# Patient Record
Sex: Female | Born: 1950 | Race: Black or African American | Hispanic: No | Smoking: Former smoker
Health system: Southern US, Community
[De-identification: ages and names within clinical notes are randomized; demographics above are authoritative.]

## PROBLEM LIST (undated history)

## (undated) DIAGNOSIS — F419 Anxiety disorder, unspecified: Secondary | ICD-10-CM

## (undated) DIAGNOSIS — K5790 Diverticulosis of intestine, part unspecified, without perforation or abscess without bleeding: Secondary | ICD-10-CM

## (undated) DIAGNOSIS — M1990 Unspecified osteoarthritis, unspecified site: Secondary | ICD-10-CM

## (undated) DIAGNOSIS — IMO0001 Reserved for inherently not codable concepts without codable children: Secondary | ICD-10-CM

## (undated) DIAGNOSIS — I10 Essential (primary) hypertension: Secondary | ICD-10-CM

## (undated) DIAGNOSIS — K219 Gastro-esophageal reflux disease without esophagitis: Secondary | ICD-10-CM

## (undated) DIAGNOSIS — G5600 Carpal tunnel syndrome, unspecified upper limb: Secondary | ICD-10-CM

## (undated) HISTORY — PX: CHOLECYSTECTOMY: SHX55

## (undated) HISTORY — PX: ABDOMINAL HYSTERECTOMY: SHX81

## (undated) HISTORY — DX: Unspecified osteoarthritis, unspecified site: M19.90

## (undated) HISTORY — DX: Carpal tunnel syndrome, unspecified upper limb: G56.00

## (undated) HISTORY — PX: TONSILLECTOMY: SUR1361

---

## 2002-07-16 ENCOUNTER — Ambulatory Visit (HOSPITAL_COMMUNITY): Admission: RE | Admit: 2002-07-16 | Discharge: 2002-07-16 | Payer: Self-pay | Admitting: Family Medicine

## 2002-07-29 ENCOUNTER — Ambulatory Visit (HOSPITAL_COMMUNITY): Admission: RE | Admit: 2002-07-29 | Discharge: 2002-07-29 | Payer: Self-pay | Admitting: Family Medicine

## 2002-09-11 ENCOUNTER — Ambulatory Visit (HOSPITAL_COMMUNITY): Admission: RE | Admit: 2002-09-11 | Discharge: 2002-09-11 | Payer: Self-pay | Admitting: General Surgery

## 2003-10-29 ENCOUNTER — Ambulatory Visit (HOSPITAL_COMMUNITY): Admission: RE | Admit: 2003-10-29 | Discharge: 2003-10-29 | Payer: Self-pay | Admitting: Orthopedic Surgery

## 2003-11-19 ENCOUNTER — Encounter: Admission: RE | Admit: 2003-11-19 | Discharge: 2003-11-19 | Payer: Self-pay | Admitting: Orthopedic Surgery

## 2003-12-03 ENCOUNTER — Encounter: Admission: RE | Admit: 2003-12-03 | Discharge: 2003-12-03 | Payer: Self-pay | Admitting: Orthopedic Surgery

## 2003-12-17 ENCOUNTER — Encounter: Admission: RE | Admit: 2003-12-17 | Discharge: 2003-12-17 | Payer: Self-pay | Admitting: Orthopedic Surgery

## 2004-02-13 ENCOUNTER — Emergency Department (HOSPITAL_COMMUNITY): Admission: EM | Admit: 2004-02-13 | Discharge: 2004-02-13 | Payer: Self-pay | Admitting: Emergency Medicine

## 2004-09-02 ENCOUNTER — Emergency Department (HOSPITAL_COMMUNITY): Admission: EM | Admit: 2004-09-02 | Discharge: 2004-09-02 | Payer: Self-pay | Admitting: Emergency Medicine

## 2004-10-11 ENCOUNTER — Emergency Department (HOSPITAL_COMMUNITY): Admission: EM | Admit: 2004-10-11 | Discharge: 2004-10-11 | Payer: Self-pay | Admitting: Emergency Medicine

## 2005-06-06 ENCOUNTER — Emergency Department (HOSPITAL_COMMUNITY): Admission: EM | Admit: 2005-06-06 | Discharge: 2005-06-06 | Payer: Self-pay | Admitting: Emergency Medicine

## 2005-06-08 ENCOUNTER — Emergency Department (HOSPITAL_COMMUNITY): Admission: EM | Admit: 2005-06-08 | Discharge: 2005-06-08 | Payer: Self-pay | Admitting: Emergency Medicine

## 2005-06-08 ENCOUNTER — Ambulatory Visit (HOSPITAL_COMMUNITY): Admission: RE | Admit: 2005-06-08 | Discharge: 2005-06-08 | Payer: Self-pay | Admitting: General Surgery

## 2005-06-10 ENCOUNTER — Emergency Department (HOSPITAL_COMMUNITY): Admission: EM | Admit: 2005-06-10 | Discharge: 2005-06-10 | Payer: Self-pay | Admitting: Emergency Medicine

## 2006-02-23 ENCOUNTER — Encounter (INDEPENDENT_AMBULATORY_CARE_PROVIDER_SITE_OTHER): Payer: Self-pay | Admitting: Family Medicine

## 2006-05-22 ENCOUNTER — Emergency Department (HOSPITAL_COMMUNITY): Admission: EM | Admit: 2006-05-22 | Discharge: 2006-05-22 | Payer: Self-pay | Admitting: Emergency Medicine

## 2006-06-02 ENCOUNTER — Emergency Department (HOSPITAL_COMMUNITY): Admission: EM | Admit: 2006-06-02 | Discharge: 2006-06-02 | Payer: Self-pay | Admitting: Emergency Medicine

## 2006-08-04 ENCOUNTER — Emergency Department (HOSPITAL_COMMUNITY): Admission: EM | Admit: 2006-08-04 | Discharge: 2006-08-04 | Payer: Self-pay | Admitting: Emergency Medicine

## 2006-08-07 ENCOUNTER — Emergency Department (HOSPITAL_COMMUNITY): Admission: EM | Admit: 2006-08-07 | Discharge: 2006-08-07 | Payer: Self-pay | Admitting: Emergency Medicine

## 2006-11-09 ENCOUNTER — Ambulatory Visit (HOSPITAL_COMMUNITY): Admission: RE | Admit: 2006-11-09 | Discharge: 2006-11-09 | Payer: Self-pay | Admitting: Family Medicine

## 2007-02-15 ENCOUNTER — Ambulatory Visit (HOSPITAL_COMMUNITY): Admission: RE | Admit: 2007-02-15 | Discharge: 2007-02-15 | Payer: Self-pay | Admitting: Family Medicine

## 2007-06-14 ENCOUNTER — Ambulatory Visit (HOSPITAL_COMMUNITY): Admission: RE | Admit: 2007-06-14 | Discharge: 2007-06-14 | Payer: Self-pay | Admitting: Family Medicine

## 2007-12-20 ENCOUNTER — Ambulatory Visit: Payer: Self-pay | Admitting: Family Medicine

## 2007-12-20 DIAGNOSIS — M5136 Other intervertebral disc degeneration, lumbar region: Secondary | ICD-10-CM

## 2007-12-20 DIAGNOSIS — E785 Hyperlipidemia, unspecified: Secondary | ICD-10-CM

## 2007-12-20 DIAGNOSIS — J42 Unspecified chronic bronchitis: Secondary | ICD-10-CM

## 2007-12-20 DIAGNOSIS — I10 Essential (primary) hypertension: Secondary | ICD-10-CM

## 2007-12-20 DIAGNOSIS — K589 Irritable bowel syndrome without diarrhea: Secondary | ICD-10-CM

## 2007-12-20 DIAGNOSIS — G5600 Carpal tunnel syndrome, unspecified upper limb: Secondary | ICD-10-CM | POA: Insufficient documentation

## 2007-12-20 DIAGNOSIS — K219 Gastro-esophageal reflux disease without esophagitis: Secondary | ICD-10-CM | POA: Insufficient documentation

## 2007-12-20 LAB — CONVERTED CEMR LAB
Bilirubin Urine: NEGATIVE
Cholesterol, target level: 200 mg/dL
Glucose, Urine, Semiquant: NEGATIVE
HDL goal, serum: 40 mg/dL
Ketones, urine, test strip: NEGATIVE
LDL Goal: 130 mg/dL
Nitrite: NEGATIVE
Specific Gravity, Urine: 1.02
Urobilinogen, UA: 1
WBC Urine, dipstick: NEGATIVE
pH: 6

## 2007-12-28 ENCOUNTER — Encounter (INDEPENDENT_AMBULATORY_CARE_PROVIDER_SITE_OTHER): Payer: Self-pay | Admitting: Family Medicine

## 2008-01-02 ENCOUNTER — Encounter (INDEPENDENT_AMBULATORY_CARE_PROVIDER_SITE_OTHER): Payer: Self-pay | Admitting: Family Medicine

## 2008-01-03 ENCOUNTER — Ambulatory Visit: Payer: Self-pay | Admitting: Family Medicine

## 2008-01-03 LAB — CONVERTED CEMR LAB
ALT: 16 units/L (ref 0–35)
AST: 15 units/L (ref 0–37)
Albumin: 4.5 g/dL (ref 3.5–5.2)
Alkaline Phosphatase: 77 units/L (ref 39–117)
BUN: 13 mg/dL (ref 6–23)
Basophils Absolute: 0 10*3/uL (ref 0.0–0.1)
Basophils Relative: 1 % (ref 0–1)
CO2: 21 meq/L (ref 19–32)
Calcium: 9.3 mg/dL (ref 8.4–10.5)
Chloride: 105 meq/L (ref 96–112)
Cholesterol: 185 mg/dL (ref 0–200)
Creatinine, Ser: 0.91 mg/dL (ref 0.40–1.20)
Eosinophils Absolute: 0.1 10*3/uL (ref 0.0–0.7)
Eosinophils Relative: 2 % (ref 0–5)
Glucose, Bld: 90 mg/dL (ref 70–99)
HCT: 40.3 % (ref 36.0–46.0)
HDL: 94 mg/dL (ref >39)
Hemoglobin: 12.9 g/dL (ref 12.0–15.0)
LDL Cholesterol: 72 mg/dL (ref 0–99)
Lymphocytes Relative: 41 % (ref 12–46)
Lymphs Abs: 2.1 10*3/uL (ref 0.7–4.0)
MCHC: 32 g/dL (ref 30.0–36.0)
MCV: 98.1 fL (ref 78.0–100.0)
Monocytes Absolute: 0.4 10*3/uL (ref 0.1–1.0)
Monocytes Relative: 8 % (ref 3–12)
Neutro Abs: 2.4 10*3/uL (ref 1.7–7.7)
Neutrophils Relative %: 48 % (ref 43–77)
Platelets: 365 10*3/uL (ref 150–400)
Potassium: 4.6 meq/L (ref 3.5–5.3)
RBC: 4.11 M/uL (ref 3.87–5.11)
RDW: 13.8 % (ref 11.5–15.5)
Sodium: 140 meq/L (ref 135–145)
TSH: 1.042 microintl units/mL (ref 0.350–4.50)
Total Bilirubin: 0.8 mg/dL (ref 0.3–1.2)
Total CHOL/HDL Ratio: 2
Total Protein: 7.3 g/dL (ref 6.0–8.3)
Triglycerides: 95 mg/dL (ref <150)
VLDL: 19 mg/dL (ref 0–40)
WBC: 5 10*3/uL (ref 4.0–10.5)

## 2008-01-15 ENCOUNTER — Ambulatory Visit (HOSPITAL_COMMUNITY): Admission: RE | Admit: 2008-01-15 | Discharge: 2008-01-15 | Payer: Self-pay | Admitting: Family Medicine

## 2008-01-29 ENCOUNTER — Encounter (HOSPITAL_COMMUNITY): Admission: RE | Admit: 2008-01-29 | Discharge: 2008-02-08 | Payer: Self-pay | Admitting: Family Medicine

## 2008-01-31 ENCOUNTER — Ambulatory Visit: Payer: Self-pay | Admitting: Family Medicine

## 2008-01-31 DIAGNOSIS — F17200 Nicotine dependence, unspecified, uncomplicated: Secondary | ICD-10-CM

## 2008-01-31 DIAGNOSIS — G4700 Insomnia, unspecified: Secondary | ICD-10-CM | POA: Insufficient documentation

## 2008-02-13 ENCOUNTER — Encounter (INDEPENDENT_AMBULATORY_CARE_PROVIDER_SITE_OTHER): Payer: Self-pay | Admitting: Family Medicine

## 2008-02-28 ENCOUNTER — Ambulatory Visit: Payer: Self-pay | Admitting: Family Medicine

## 2008-03-05 ENCOUNTER — Ambulatory Visit (HOSPITAL_COMMUNITY): Admission: RE | Admit: 2008-03-05 | Discharge: 2008-03-05 | Payer: Self-pay | Admitting: Family Medicine

## 2008-03-18 ENCOUNTER — Encounter (INDEPENDENT_AMBULATORY_CARE_PROVIDER_SITE_OTHER): Payer: Self-pay | Admitting: Family Medicine

## 2008-03-26 ENCOUNTER — Ambulatory Visit (HOSPITAL_COMMUNITY): Admission: RE | Admit: 2008-03-26 | Discharge: 2008-03-26 | Payer: Self-pay | Admitting: Internal Medicine

## 2008-03-26 ENCOUNTER — Ambulatory Visit: Payer: Self-pay | Admitting: Internal Medicine

## 2008-04-15 ENCOUNTER — Telehealth (INDEPENDENT_AMBULATORY_CARE_PROVIDER_SITE_OTHER): Payer: Self-pay | Admitting: *Deleted

## 2008-04-18 ENCOUNTER — Ambulatory Visit: Payer: Self-pay | Admitting: Family Medicine

## 2008-04-18 ENCOUNTER — Ambulatory Visit (HOSPITAL_COMMUNITY): Admission: RE | Admit: 2008-04-18 | Discharge: 2008-04-18 | Payer: Self-pay | Admitting: Family Medicine

## 2008-04-23 ENCOUNTER — Encounter: Payer: Self-pay | Admitting: Gastroenterology

## 2008-05-01 ENCOUNTER — Ambulatory Visit: Payer: Self-pay | Admitting: Family Medicine

## 2008-05-29 ENCOUNTER — Ambulatory Visit: Payer: Self-pay | Admitting: Family Medicine

## 2008-05-29 ENCOUNTER — Ambulatory Visit (HOSPITAL_COMMUNITY): Admission: RE | Admit: 2008-05-29 | Discharge: 2008-05-29 | Payer: Self-pay | Admitting: Family Medicine

## 2008-05-29 LAB — CONVERTED CEMR LAB

## 2008-05-30 ENCOUNTER — Encounter (INDEPENDENT_AMBULATORY_CARE_PROVIDER_SITE_OTHER): Payer: Self-pay | Admitting: Family Medicine

## 2008-05-30 LAB — CONVERTED CEMR LAB
ALT: 17 units/L (ref 0–35)
AST: 19 units/L (ref 0–37)
Albumin: 4.1 g/dL (ref 3.5–5.2)
Alkaline Phosphatase: 84 units/L (ref 39–117)
Amylase: 35 units/L (ref 0–105)
BUN: 9 mg/dL (ref 6–23)
Basophils Absolute: 0 10*3/uL (ref 0.0–0.1)
Basophils Relative: 1 % (ref 0–1)
CO2: 21 meq/L (ref 19–32)
Calcium: 9 mg/dL (ref 8.4–10.5)
Chloride: 108 meq/L (ref 96–112)
Creatinine, Ser: 0.8 mg/dL (ref 0.40–1.20)
Eosinophils Absolute: 0.1 10*3/uL (ref 0.0–0.7)
Eosinophils Relative: 3 % (ref 0–5)
Glucose, Bld: 91 mg/dL (ref 70–99)
HCT: 38 % (ref 36.0–46.0)
Hemoglobin: 12.9 g/dL (ref 12.0–15.0)
Lipase: 18 units/L (ref 0–75)
Lymphocytes Relative: 36 % (ref 12–46)
Lymphs Abs: 1.9 10*3/uL (ref 0.7–4.0)
MCHC: 33.9 g/dL (ref 30.0–36.0)
MCV: 95 fL (ref 78.0–100.0)
Monocytes Absolute: 0.5 10*3/uL (ref 0.1–1.0)
Monocytes Relative: 9 % (ref 3–12)
Neutro Abs: 2.7 10*3/uL (ref 1.7–7.7)
Neutrophils Relative %: 52 % (ref 43–77)
Platelets: 373 10*3/uL (ref 150–400)
Potassium: 4.1 meq/L (ref 3.5–5.3)
RBC: 4 M/uL (ref 3.87–5.11)
RDW: 13.5 % (ref 11.5–15.5)
Sodium: 141 meq/L (ref 135–145)
Total Bilirubin: 0.4 mg/dL (ref 0.3–1.2)
Total Protein: 6.8 g/dL (ref 6.0–8.3)
WBC: 5.2 10*3/uL (ref 4.0–10.5)

## 2008-06-26 ENCOUNTER — Ambulatory Visit: Payer: Self-pay | Admitting: Family Medicine

## 2008-07-17 ENCOUNTER — Ambulatory Visit: Payer: Self-pay | Admitting: Family Medicine

## 2008-07-17 ENCOUNTER — Ambulatory Visit (HOSPITAL_COMMUNITY): Admission: RE | Admit: 2008-07-17 | Discharge: 2008-07-17 | Payer: Self-pay | Admitting: Family Medicine

## 2008-07-17 ENCOUNTER — Telehealth (INDEPENDENT_AMBULATORY_CARE_PROVIDER_SITE_OTHER): Payer: Self-pay | Admitting: *Deleted

## 2008-07-17 LAB — CONVERTED CEMR LAB
Bilirubin Urine: NEGATIVE
Blood in Urine, dipstick: NEGATIVE
Glucose, Urine, Semiquant: NEGATIVE
Ketones, urine, test strip: NEGATIVE
Nitrite: NEGATIVE
Protein, U semiquant: NEGATIVE
Specific Gravity, Urine: 1.015
Urobilinogen, UA: 0.2
WBC Urine, dipstick: NEGATIVE
pH: 5.5

## 2008-07-23 ENCOUNTER — Encounter (INDEPENDENT_AMBULATORY_CARE_PROVIDER_SITE_OTHER): Payer: Self-pay | Admitting: Family Medicine

## 2008-08-07 ENCOUNTER — Ambulatory Visit: Payer: Self-pay | Admitting: Family Medicine

## 2008-09-04 ENCOUNTER — Ambulatory Visit: Payer: Self-pay | Admitting: Family Medicine

## 2009-02-04 ENCOUNTER — Emergency Department (HOSPITAL_COMMUNITY): Admission: EM | Admit: 2009-02-04 | Discharge: 2009-02-05 | Payer: Self-pay | Admitting: Emergency Medicine

## 2009-04-13 ENCOUNTER — Ambulatory Visit (HOSPITAL_COMMUNITY): Admission: RE | Admit: 2009-04-13 | Discharge: 2009-04-13 | Payer: Self-pay | Admitting: Family Medicine

## 2009-04-27 ENCOUNTER — Encounter (INDEPENDENT_AMBULATORY_CARE_PROVIDER_SITE_OTHER): Payer: Self-pay | Admitting: *Deleted

## 2010-02-07 ENCOUNTER — Encounter: Payer: Self-pay | Admitting: Family Medicine

## 2010-02-16 NOTE — Letter (Signed)
Summary: MEDICAL RELEASE  MEDICAL RELEASE   Imported By: Lind Guest 04/27/2009 13:23:07  _____________________________________________________________________  External Attachment:    Type:   Image     Comment:   External Document

## 2010-05-06 ENCOUNTER — Emergency Department (HOSPITAL_COMMUNITY): Payer: PRIVATE HEALTH INSURANCE

## 2010-05-06 ENCOUNTER — Emergency Department (HOSPITAL_COMMUNITY)
Admission: EM | Admit: 2010-05-06 | Discharge: 2010-05-07 | Disposition: A | Discharge status: Home / Self Care | Payer: PRIVATE HEALTH INSURANCE | Attending: Emergency Medicine | Admitting: Emergency Medicine

## 2010-05-06 DIAGNOSIS — R079 Chest pain, unspecified: Secondary | ICD-10-CM | POA: Insufficient documentation

## 2010-05-06 DIAGNOSIS — Z79899 Other long term (current) drug therapy: Secondary | ICD-10-CM | POA: Insufficient documentation

## 2010-05-06 DIAGNOSIS — Z7901 Long term (current) use of anticoagulants: Secondary | ICD-10-CM | POA: Insufficient documentation

## 2010-05-06 DIAGNOSIS — I4949 Other premature depolarization: Secondary | ICD-10-CM | POA: Insufficient documentation

## 2010-05-06 LAB — CBC
MCHC: 33.8 g/dL (ref 30.0–36.0)
MCV: 99 fL (ref 78.0–100.0)
Platelets: 336 10*3/uL (ref 150–400)
RDW: 13.8 % (ref 11.5–15.5)
WBC: 7 10*3/uL (ref 4.0–10.5)

## 2010-05-06 LAB — DIFFERENTIAL
Basophils Absolute: 0 10*3/uL (ref 0.0–0.1)
Eosinophils Absolute: 0.2 10*3/uL (ref 0.0–0.7)
Eosinophils Relative: 2 % (ref 0–5)
Monocytes Absolute: 0.4 10*3/uL (ref 0.1–1.0)

## 2010-05-06 LAB — POCT CARDIAC MARKERS
CKMB, poc: <1 ng/mL — ABNORMAL LOW (ref 1.0–8.0)
Myoglobin, poc: 32 ng/mL (ref 12–200)
Troponin i, poc: <0.05 ng/mL (ref 0.00–0.09)

## 2010-05-07 LAB — BASIC METABOLIC PANEL
Calcium: 9 mg/dL (ref 8.4–10.5)
Creatinine, Ser: 0.93 mg/dL (ref 0.4–1.2)
GFR calc Af Amer: >60 mL/min (ref >60)
GFR calc non Af Amer: >60 mL/min (ref >60)
Glucose, Bld: 108 mg/dL — ABNORMAL HIGH (ref 70–99)
Sodium: 136 mEq/L (ref 135–145)

## 2010-06-01 NOTE — Op Note (Signed)
NAME:  Tammy Austin, Tammy Austin                ACCOUNT NO.:  [id number]   MEDICAL RECORD NO.:  [id number]          PATIENT TYPE:  AMB   LOCATION:  DAY                           FACILITY:  [facility]   PHYSICIAN:  R. Roetta Sessions, M.D. DATE OF BIRTH:  1950/10/31   DATE OF PROCEDURE:  03/26/2008  DATE OF DISCHARGE:                               OPERATIVE REPORT   PROCEDURE:  Screening colonoscopy.   INDICATIONS FOR PROCEDURE:  A 61 year old lady, referred by Dr. Erby Pian  for colorectal cancer screening.  Last colonoscopy several years ago by  Dr. Katrinka Blazing.  She reportedly has a history of diverticulosis.  No family  history of colon cancer in any first-degree relatives.  She currently  has no lower GI tract symptoms.  Risks, benefits, alternatives and  limitations have been discussed, questions answered.  She is agreeable.  Please see the documentation in the medical record.   PROCEDURE NOTE:  O2 saturation, blood pressure, pulse, respirations were  monitored throughout the entire procedure.   CONSCIOUS SEDATION:  Versed 4 mg IV, Demerol 100 mg IV in divided doses.   INSTRUMENT:  Pentax video chip system.   FINDINGS:  Digital rectal exam revealed no abnormalities.  The prep was  marginal to poor, but do-able.  Colon:  Colonic mucosa was surveyed from  rectosigmoid junction through the left transverse, right colon to the  appendiceal orifice, ileocecal valve, cecum.  These structures well-seen  and photographed for the record.  From this level, the scope was slowly  withdrawn.  All previously-mentioned mucosal surfaces were again seen.  Copious lavage and suctioning had to take place on the way in to get  adequate visualization.  There was quite a bit of granular, viscous  stool throughout the colon, which made exam more challenging.  Cecum,  ileocecal valve, appendiceal orifice were well-identified.   From this level, the scope was slowly and cautiously withdrawn.  All  previously-mentioned mucosal  surfaces were again seen.  The patient was  noted to have pancolonic diverticula.  Remainder of colonic mucosa  appeared normal.  The scope was put within the rectum, where thorough  examination of the rectal mucosa, including retroflexion in anal verge,  demonstrated no abnormalities.  The patient tolerated the procedure  well, was reacted in endoscopy.   IMPRESSION:  1. Normal rectum.  2. Pancolonic diverticula.  Remainder of colonic mucosa appeared      normal.  3. Cecal withdrawal time over 6 minutes.   RECOMMENDATIONS:  Diverticulosis literature provided to Ms. Brockbank.  Recommend repeat screening colonoscopy 10 years.      Jonathon Bellows, M.D.  Electronically Signed     RMR/MEDQ  D:  03/26/2008  T:  03/26/2008  Job:  [id number]   cc:   Franchot Heidelberg, M.D.

## 2010-06-04 NOTE — H&P (Signed)
   NAME:  Tammy Austin, Tammy Austin                          ACCOUNT NO.:  [id number]   MEDICAL RECORD NO.:  [id number]                   PATIENT TYPE:  AMB   LOCATION:  DAY                                  FACILITY:  [facility]   PHYSICIAN:  Jerolyn Shin C. Katrinka Blazing, M.D.                DATE OF BIRTH:  Jan 02, 1951   DATE OF ADMISSION:  DATE OF DISCHARGE:                                HISTORY & PHYSICAL   HISTORY OF PRESENT ILLNESS:  [age]-year-old patient with a history of  osteoarthritis, chronic abdominal pain and bilateral knee pain.  The patient  has had episodes of increasing gaseous distention, pain in her left lower  quadrant, chronic constipation requiring laxatives.  She has been followed  by the health department and is referred for upper and lower endoscopies.   PAST HISTORY:  1. She has chronic depression.  2. Osteoarthritis.  3. Peptic ulcer disease.  4. Hepatitis B.   MEDICATIONS:  Her only medication is Tylenol.  She was recently started on:  1. Prilosec 40 mg b.i.d.  2. Ultram 50 mg q.i.d.   PAST SURGICAL HISTORY:  Surgery includes:  1. Abdominal hysterectomy.  2. Cholecystectomy.   FAMILY HISTORY:  Family history is positive for hypertension,  atherosclerotic heart disease, diabetes mellitus, DVT, depression and  prostate cancer.   SOCIAL HISTORY:  She is divorced.  She drinks one quart of beer per day.  She smokes a half a pack of cigarettes per day.  She is unemployed.  She has  one child.   REVIEW OF SYSTEMS:  Review of systems is positive for headache, heartburn,  flatulence, pain and bloating, diffuse joint pain and depression.   PHYSICAL EXAMINATION:  VITAL SIGNS:  Blood pressure 120/84, pulse 82,  respirations 16, weight 149 pounds.  HEENT:  Unremarkable.  NECK:  Neck is supple without JVD or bruit.  CHEST:  Chest clear to auscultation without rales or rub.  HEART:  Regular rate and rhythm without murmur, gallop or rub.  ABDOMEN:  Abdomen soft and nontender.  No  masses.  RECTAL:  Normal.  Stool guaiac negative.  EXTREMITIES:  Positive crepitus of both knees, otherwise, normal.  NEUROLOGIC:  No focal motor, sensory or cerebellar deficit.   IMPRESSION:  1. Recurrent abdominal pain with change in bowel habits.  2. Peptic ulcer disease by history with possible exacerbation.  3. Osteoarthritis.  4. Hepatitis B.   PLAN:  The patient will have upper and lower endoscopies.                                               Dirk Dress. Katrinka Blazing, M.D.    LCS/MEDQ  D:  09/10/2002  T:  09/11/2002  Job:  [id number]

## 2010-11-01 LAB — CULTURE, ROUTINE-ABSCESS

## 2011-06-02 ENCOUNTER — Ambulatory Visit: Payer: Medicaid Other | Admitting: Family Medicine

## 2011-06-21 ENCOUNTER — Ambulatory Visit: Payer: Medicaid Other | Admitting: Family Medicine

## 2011-06-28 ENCOUNTER — Emergency Department (HOSPITAL_COMMUNITY)
Admission: EM | Admit: 2011-06-28 | Discharge: 2011-06-28 | Disposition: A | Discharge status: Home / Self Care | Payer: PRIVATE HEALTH INSURANCE | Attending: Emergency Medicine | Admitting: Emergency Medicine

## 2011-06-28 ENCOUNTER — Encounter (HOSPITAL_COMMUNITY): Payer: Self-pay | Admitting: *Deleted

## 2011-06-28 DIAGNOSIS — F17200 Nicotine dependence, unspecified, uncomplicated: Secondary | ICD-10-CM | POA: Insufficient documentation

## 2011-06-28 DIAGNOSIS — T3995XA Adverse effect of unspecified nonopioid analgesic, antipyretic and antirheumatic, initial encounter: Secondary | ICD-10-CM | POA: Insufficient documentation

## 2011-06-28 DIAGNOSIS — K219 Gastro-esophageal reflux disease without esophagitis: Secondary | ICD-10-CM | POA: Insufficient documentation

## 2011-06-28 DIAGNOSIS — K2960 Other gastritis without bleeding: Secondary | ICD-10-CM | POA: Insufficient documentation

## 2011-06-28 DIAGNOSIS — I10 Essential (primary) hypertension: Secondary | ICD-10-CM | POA: Insufficient documentation

## 2011-06-28 HISTORY — DX: Reserved for inherently not codable concepts without codable children: IMO0001

## 2011-06-28 HISTORY — DX: Diverticulosis of intestine, part unspecified, without perforation or abscess without bleeding: K57.90

## 2011-06-28 HISTORY — DX: Essential (primary) hypertension: I10

## 2011-06-28 HISTORY — DX: Gastro-esophageal reflux disease without esophagitis: K21.9

## 2011-06-28 MED ORDER — PANTOPRAZOLE SODIUM 40 MG PO TBEC
40.0000 mg | DELAYED_RELEASE_TABLET | Freq: Once | ORAL | Status: AC
  Administered 2011-06-28: 40 mg via ORAL
  Filled 2011-06-28: qty 1

## 2011-06-28 MED ORDER — TRAMADOL HCL 50 MG PO TABS: 50.0000 mg | ORAL_TABLET | Freq: Four times a day (QID) | ORAL | Status: AC | PRN

## 2011-06-28 MED ORDER — CALCIUM CARBONATE ANTACID 600 MG PO CHEW: 600.0000 mg | CHEWABLE_TABLET | Freq: Three times a day (TID) | ORAL | Status: DC | PRN

## 2011-06-28 MED ORDER — OMEPRAZOLE 40 MG PO CPDR: 40.0000 mg | DELAYED_RELEASE_CAPSULE | Freq: Every day | ORAL | Status: DC

## 2011-06-28 MED ORDER — GI COCKTAIL ~~LOC~~
30.0000 mL | Freq: Once | ORAL | Status: AC
  Administered 2011-06-28: 30 mL via ORAL
  Filled 2011-06-28: qty 30

## 2011-06-28 NOTE — Discharge Instructions (Signed)
Stop taking Naproxen and all other non-steroidal anti-inflammatory medicines. Follow up with your primary MD for referral to gastroenterologist if symptoms persist.   Gastritis Gastritis is an inflammation (the body's way of reacting to injury and/or infection) of the stomach. It is often caused by viral or bacterial (germ) infections. It can also be caused by chemicals (including alcohol) and medications. This illness may be associated with generalized malaise (feeling tired, not well), cramps, and fever. The illness may last 2 to 7 days. If symptoms of gastritis continue, gastroscopy (looking into the stomach with a telescope-like instrument), biopsy (taking tissue samples), and/or blood tests may be necessary to determine the cause. Antibiotics will not affect the illness unless there is a bacterial infection present. One common bacterial cause of gastritis is an organism known as H. Pylori. This can be treated with antibiotics. Other forms of gastritis are caused by too much acid in the stomach. They can be treated with medications such as H2 blockers and antacids. Home treatment is usually all that is needed. Young children will quickly become dehydrated (loss of body fluids) if vomiting and diarrhea are both present. Medications may be given to control nausea. Medications are usually not given for diarrhea unless especially bothersome. Some medications slow the removal of the virus from the gastrointestinal tract. This slows down the healing process. HOME CARE INSTRUCTIONS Home care instructions for nausea and vomiting:  For adults: drink small amounts of fluids often. Drink at least 2 quarts a day. Take sips frequently. Do not drink large amounts of fluid at one time. This may worsen the nausea.   Only take over-the-counter or prescription medicines for pain, discomfort, or fever as directed by your caregiver.   Drink clear liquids only. Those are anything you can see through such as water, broth,  or soft drinks.   Once you are keeping clear liquids down, you may start full liquids, soups, juices, and ice cream or sherbet. Slowly add bland (plain, not spicy) foods to your diet.  Home care instructions for diarrhea:  Diarrhea can be caused by bacterial infections or a virus. Your condition should improve with time, rest, fluids, and/or anti-diarrheal medication.   Until your diarrhea is under control, you should drink clear liquids often in small amounts. Clear liquids include: water, broth, jell-o water and weak tea.  Avoid:  Milk.   Fruits.   Tobacco.   Alcohol.   Extremely hot or cold fluids.   Too much intake of anything at one time.  When your diarrhea stops you may add the following foods, which help the stool to become more formed:  Rice.   Bananas.   Apples without skin.   Dry toast.  Once these foods are tolerated you may add low-fat yogurt and low-fat cottage cheese. They will help to restore the normal bacterial balance in your bowel. Wash your hands well to avoid spreading bacteria (germ) or virus. SEEK IMMEDIATE MEDICAL CARE IF:   You are unable to keep fluids down.   Vomiting or diarrhea become persistent (constant).   Abdominal pain develops, increases, or localizes. (Right sided pain can be appendicitis. Left sided pain in adults can be diverticulitis.)   You develop a fever (an oral temperature above 102 F (38.9 C)).   Diarrhea becomes excessive or contains blood or mucus.   You have excessive weakness, dizziness, fainting or extreme thirst.   You are not improving or you are getting worse.   You have any other questions or concerns.  Document Released: 12/28/2000 Document Revised: 12/23/2010 Document Reviewed: 01/03/2005 [facility] Patient Information 2012 [place], [place].

## 2011-06-28 NOTE — ED Provider Notes (Signed)
History  This chart was scribed for Tammy Racer, MD by Tammy Austin. This patient was seen in room [facility] and the patient's care was started at 3:15PM.  CSN: [id number]  Arrival date & time 06/28/11  1504   First MD Initiated Contact with Patient 06/28/11 1515      Chief Complaint  Patient presents with  . Abdominal Pain    Patient is a 61 y.o. female presenting with abdominal pain. The history is provided by the patient. No language interpreter was used.  Abdominal Pain The primary symptoms of the illness include abdominal pain and nausea. The primary symptoms of the illness do not include fever, vomiting or diarrhea. The current episode started 13 to 24 hours ago. The onset of the illness was gradual. The problem has been gradually worsening.  The abdominal pain is located in the epigastric region. The abdominal pain does not radiate.  Symptoms associated with the illness do not include hematuria, frequency or back pain. Significant associated medical issues include GERD and diverticulitis.    Tammy Austin is a 61 y.o. female who presents to the Emergency Department complaining of gradual onset, gradually worsening, constant epigastric tenderness with associated nausea that started last night. She has a h/o GERD but states that this pain is different from prior episodes of GERD.  She reports taking Pepto bismol and milk with no improvement in her symptoms. She is currently taking 500 mg of naproxen twice a day for joint pain. She states that she ran out of omeprazole 2 days ago. She denies diarrhea and emesis as associated symptoms. She also has a h/o HTN and diverticulitis.  She is a current everyday smoker and occasional alcohol use.    Past Medical History  Diagnosis Date  . Hypertension   . Reflux   . Diverticular disease     Past Surgical History  Procedure Date  . Abdominal hysterectomy   . Tonsillectomy   . Cholecystectomy     History reviewed. No  pertinent family history.  History  Substance Use Topics  . Smoking status: Current Everyday Smoker  . Smokeless tobacco: Not on file  . Alcohol Use: Yes    Review of Systems  Constitutional: Negative for fever.  HENT: Negative for congestion, sinus pressure and ear discharge.   Eyes: Negative for discharge.  Respiratory: Negative for cough.   Cardiovascular: Negative for chest pain.  Gastrointestinal: Positive for nausea and abdominal pain. Negative for vomiting and diarrhea.  Genitourinary: Negative for frequency and hematuria.  Musculoskeletal: Negative for back pain.  Skin: Negative for rash.  Neurological: Negative for seizures and headaches.  Hematological: Negative.   Psychiatric/Behavioral: Negative for hallucinations.    Allergies  Ibuprofen  Home Medications   Current Outpatient Rx  Name Route Sig Dispense Refill  . AMLODIPINE BESYLATE 5 MG PO TABS Oral Take 5 mg by mouth daily.    Marland Kitchen BISACODYL LAXATIVE PO Oral Take 15-30 mLs by mouth as needed. For relief    . NAPROXEN 500 MG PO TABS Oral Take 500 mg by mouth 2 (two) times daily.    Marland Kitchen OMEPRAZOLE 20 MG PO CPDR Oral Take 20 mg by mouth daily.    Marland Kitchen CALCIUM CARBONATE ANTACID 600 MG PO CHEW Oral Chew 1 tablet (600 mg total) by mouth 3 (three) times daily as needed for heartburn. 90 tablet 0  . OMEPRAZOLE 40 MG PO CPDR Oral Take 1 capsule (40 mg total) by mouth daily. 30 capsule 0  . TRAMADOL HCL 50  MG PO TABS Oral Take 1 tablet (50 mg total) by mouth every 6 (six) hours as needed for pain. 20 tablet 0    Triage Vitals: BP 140/95  Pulse 89  Temp(Src) 98.1 F (36.7 C) (Oral)  Resp 18  Ht 5\' 5"  (1.651 m)  Wt 172 lb (78.019 kg)  BMI 28.62 kg/m2  SpO2 96%  Physical Exam  Nursing note and vitals reviewed. Constitutional: She is oriented to person, place, and time. She appears well-developed and well-nourished.  HENT:  Head: Normocephalic and atraumatic.  Eyes: Conjunctivae and EOM are normal. No scleral icterus.    Neck: Neck supple. No thyromegaly present.  Cardiovascular: Normal rate and regular rhythm.  Exam reveals no gallop and no friction rub.   No murmur heard. Pulmonary/Chest: No stridor. She has no wheezes. She has no rales. She exhibits no tenderness.  Abdominal: Soft. She exhibits no distension. There is tenderness (mild epigastric pain). There is no rebound.  Musculoskeletal: Normal range of motion. She exhibits no edema.  Lymphadenopathy:    She has no cervical adenopathy.  Neurological: She is oriented to person, place, and time. Coordination normal.  Skin: No rash noted. No erythema.  Psychiatric: She has a normal mood and affect. Her behavior is normal.    ED Course  Procedures (including critical care time)  DIAGNOSTIC STUDIES: Oxygen Saturation is 96% on room air, adequate by my interpretation.    COORDINATION OF CARE: 3:21PM-Discussed treatment plan with pt and pt agreed to plan.  Labs Reviewed - No data to display No results found.   1. NSAID induced gastritis       MDM  I personally performed the services described in this documentation, which was scribed in my presence. The recorded information has been reviewed and considered.  Advised to stop taking Naproxen and f/u with PMD for GI referral if symptoms persist. Return to ED if symptoms worsen    Tammy Racer, MD 06/28/11 [phone]

## 2011-06-28 NOTE — ED Notes (Signed)
Patient with no complaints at this time. Respirations even and unlabored. Skin warm/dry. Discharge instructions reviewed with patient at this time. Patient given opportunity to voice concerns/ask questions. Patient discharged at this time and left Emergency Department with steady gait.   

## 2011-06-28 NOTE — ED Notes (Signed)
Upper abd pain since yesterday, nausea, no diarrhea.  Usually feels better when drinks goats milk.  Out of stomach meds.

## 2011-07-28 ENCOUNTER — Ambulatory Visit (INDEPENDENT_AMBULATORY_CARE_PROVIDER_SITE_OTHER): Payer: Medicaid Other | Admitting: Family Medicine

## 2011-07-28 ENCOUNTER — Encounter: Payer: Self-pay | Admitting: Family Medicine

## 2011-07-28 VITALS — BP 120/90 | HR 82 | Resp 16 | Ht 65.0 in | Wt 174.4 lb

## 2011-07-28 DIAGNOSIS — I10 Essential (primary) hypertension: Secondary | ICD-10-CM

## 2011-07-28 DIAGNOSIS — E785 Hyperlipidemia, unspecified: Secondary | ICD-10-CM

## 2011-07-28 DIAGNOSIS — F17200 Nicotine dependence, unspecified, uncomplicated: Secondary | ICD-10-CM

## 2011-07-28 DIAGNOSIS — M545 Low back pain, unspecified: Secondary | ICD-10-CM

## 2011-07-28 DIAGNOSIS — K589 Irritable bowel syndrome without diarrhea: Secondary | ICD-10-CM

## 2011-07-28 DIAGNOSIS — K219 Gastro-esophageal reflux disease without esophagitis: Secondary | ICD-10-CM

## 2011-07-28 DIAGNOSIS — M129 Arthropathy, unspecified: Secondary | ICD-10-CM

## 2011-07-28 MED ORDER — OMEPRAZOLE 20 MG PO CPDR: 20.0000 mg | DELAYED_RELEASE_CAPSULE | Freq: Every day | ORAL | Status: DC

## 2011-07-28 MED ORDER — AMLODIPINE BESYLATE 5 MG PO TABS: 5.0000 mg | ORAL_TABLET | Freq: Every day | ORAL | Status: DC

## 2011-07-28 NOTE — Progress Notes (Signed)
  Subjective:    Patient ID: Tammy Austin, female    DOB: [birth year], 61 y.o.   MRN: [id number]  HPI Pt presents to establish medical care, previous PCP [facility]. History and medications reviewed She was recently seen at [facility] ED for arthritic pain, had x-rays done, has history of OA, currenlty on ultram, was on naprosyn but this caused upset stomach. History of GERD on  PPI- pt has colonoscopy per report HTN- has been on medication for 3-4 years on norvsac currently Disability has been on disability for many years secondary to OA, knee pain and back pain, she has had epidural injectionsi n the past.     Review of Systems  GEN- denies fatigue, fever, weight loss,weakness, recent illness HEENT- denies eye drainage, change in vision, nasal discharge, CVS- denies chest pain, palpitations RESP- denies SOB, cough, wheeze ABD- denies N/V, change in stools, abd pain GU- denies dysuria, hematuria, dribbling, incontinence MSK- + joint pain, muscle aches, injury Neuro- denies headache, dizziness, syncope, seizure activity      Objective:   Physical Exam GEN- NAD, alert and oriented x3 HEENT- PERRL, EOMI, non injected sclera, pink conjunctiva, MMM, oropharynx clear Neck- Supple, no thryomegaly CVS- RRR, no murmur RESP-CTAB EXT- No edema Pulses- Radial, DP- 2+        Assessment & Plan:

## 2011-07-28 NOTE — Patient Instructions (Signed)
I will get the records from [facility] and [facility] I have refilled your medications Get the labs done next week - nothing to eat after midnight- no appointment needed Schedule a Mammogram  F/U 3 months

## 2011-07-31 NOTE — Assessment & Plan Note (Signed)
Reviewed Colonoscopy , diverticular diease also noted

## 2011-07-31 NOTE — Assessment & Plan Note (Signed)
multijoint OA, on ultram as needed, continue activity as tolerated

## 2011-07-31 NOTE — Assessment & Plan Note (Signed)
BP looks good today, continue norvasc

## 2011-07-31 NOTE — Assessment & Plan Note (Signed)
ultram

## 2011-07-31 NOTE — Assessment & Plan Note (Signed)
Due for labs, will check meds pending results

## 2011-07-31 NOTE — Assessment & Plan Note (Signed)
Continue PPI, medications refilled,

## 2011-07-31 NOTE — Assessment & Plan Note (Addendum)
She has tried meditation, fasting, ptaches and gum without success, she does not want to try meds today

## 2011-08-02 ENCOUNTER — Other Ambulatory Visit: Payer: Self-pay | Admitting: Family Medicine

## 2011-08-02 DIAGNOSIS — Z139 Encounter for screening, unspecified: Secondary | ICD-10-CM

## 2011-08-05 ENCOUNTER — Ambulatory Visit (HOSPITAL_COMMUNITY)
Admission: RE | Admit: 2011-08-05 | Discharge: 2011-08-05 | Disposition: A | Discharge status: Home / Self Care | Payer: PRIVATE HEALTH INSURANCE | Source: Ambulatory Visit | Attending: Family Medicine | Admitting: Family Medicine

## 2011-08-05 DIAGNOSIS — Z139 Encounter for screening, unspecified: Secondary | ICD-10-CM

## 2011-08-05 DIAGNOSIS — Z1231 Encounter for screening mammogram for malignant neoplasm of breast: Secondary | ICD-10-CM | POA: Insufficient documentation

## 2011-08-06 LAB — CBC
MCV: 94.8 fL (ref 78.0–100.0)
Platelets: 344 10*3/uL (ref 150–400)
RBC: 4.42 MIL/uL (ref 3.87–5.11)
RDW: 13.5 % (ref 11.5–15.5)
WBC: 4.4 10*3/uL (ref 4.0–10.5)

## 2011-08-06 LAB — COMPREHENSIVE METABOLIC PANEL
ALT: 43 U/L — ABNORMAL HIGH (ref 0–35)
Albumin: 4.1 g/dL (ref 3.5–5.2)
CO2: 27 mEq/L (ref 19–32)
Calcium: 9.5 mg/dL (ref 8.4–10.5)
Chloride: 105 mEq/L (ref 96–112)
Glucose, Bld: 90 mg/dL (ref 70–99)
Sodium: 139 mEq/L (ref 135–145)
Total Bilirubin: 0.4 mg/dL (ref 0.3–1.2)
Total Protein: 6.7 g/dL (ref 6.0–8.3)

## 2011-08-06 LAB — LIPID PANEL
Cholesterol: 204 mg/dL — ABNORMAL HIGH (ref 0–200)
HDL: 55 mg/dL (ref >39)
Total CHOL/HDL Ratio: 3.7 Ratio
Triglycerides: 161 mg/dL — ABNORMAL HIGH (ref <150)

## 2011-08-06 LAB — TSH: TSH: 1.312 u[IU]/mL (ref 0.350–4.500)

## 2011-10-07 ENCOUNTER — Ambulatory Visit (INDEPENDENT_AMBULATORY_CARE_PROVIDER_SITE_OTHER): Payer: PRIVATE HEALTH INSURANCE | Admitting: Family Medicine

## 2011-10-07 ENCOUNTER — Encounter: Payer: Self-pay | Admitting: Family Medicine

## 2011-10-07 VITALS — BP 122/90 | HR 86 | Resp 15 | Ht 65.0 in | Wt 175.4 lb

## 2011-10-07 DIAGNOSIS — E785 Hyperlipidemia, unspecified: Secondary | ICD-10-CM

## 2011-10-07 DIAGNOSIS — Z23 Encounter for immunization: Secondary | ICD-10-CM

## 2011-10-07 DIAGNOSIS — I10 Essential (primary) hypertension: Secondary | ICD-10-CM

## 2011-10-07 DIAGNOSIS — M545 Low back pain, unspecified: Secondary | ICD-10-CM

## 2011-10-07 DIAGNOSIS — F17200 Nicotine dependence, unspecified, uncomplicated: Secondary | ICD-10-CM

## 2011-10-07 DIAGNOSIS — M129 Arthropathy, unspecified: Secondary | ICD-10-CM

## 2011-10-07 MED ORDER — BUPROPION HCL ER (SR) 150 MG PO TB12: 150.0000 mg | ORAL_TABLET | Freq: Two times a day (BID) | ORAL | Status: DC

## 2011-10-07 MED ORDER — HYDROCODONE-ACETAMINOPHEN 5-325 MG PO TABS: 1.0000 | ORAL_TABLET | Freq: Three times a day (TID) | ORAL | Status: DC | PRN

## 2011-10-07 NOTE — Patient Instructions (Addendum)
Try the new smoking pill- well butrin- take 1 pill a day for 3 days, then increase to 1 pill twice a day Pain contract started for hydrocodone Referral for back injections Continue to work on your exercise and diet, avoid fried food, fast food, sweet tea, soda, french fries F/U 4 months

## 2011-10-09 ENCOUNTER — Encounter: Payer: Self-pay | Admitting: Family Medicine

## 2011-10-09 NOTE — Progress Notes (Signed)
  Subjective:    Patient ID: Tammy Austin, female    DOB: October 03, 1950, 61 y.o.   MRN: [id number]  HPI Pt here to f/u chronic medical problems. COntinues to have generalzied aches and pains in her joints, bilateral shoulders and lower back the worst. She would like to try injections again in her lower back. No change in bowel or bladder.  Ultram has not helped with pain.  HTN- tolerating BP medications   Review of Systems  GEN- denies fatigue, fever, weight loss,weakness, recent illness HEENT- denies eye drainage, change in vision, nasal discharge, CVS- denies chest pain, palpitations RESP- denies SOB, cough, wheeze ABD- denies N/V, change in stools, abd pain GU- denies dysuria, hematuria, dribbling, incontinence MSK- + joint pain, muscle aches, injury Neuro- denies headache, dizziness, syncope, seizure activity       Objective:   Physical Exam GEN- NAD, alert and oriented x3 HEENT- PERRL, EOMI, non injected sclera, pink conjunctiva, MMM, oropharynx clear Neck- Supple, fair ROM CVS- RRR, no murmur RESP-CTAB Back- TTP lumbar spine, neg SLR MSK- Shoulder- rotator cuff and biceps in tact, decreased ROM generally bilat Neuro- motor and strength equal bilat lower ext EXT- No edema Pulses- Radial, DP- 2+        Assessment & Plan:

## 2011-10-09 NOTE — Assessment & Plan Note (Signed)
Pt to work on diet for control of lipids

## 2011-10-09 NOTE — Assessment & Plan Note (Signed)
Spondylosis and OA, history of injections in the past, refer to Dr. Eduard Clos to see if she is a good candidate for repeat injections, no new neurological symptoms, reviewed xrays in chart

## 2011-10-09 NOTE — Assessment & Plan Note (Signed)
She is having difficulty quitting, will give trial of wellbutrin, quit date set for 2 weeks from today

## 2011-10-09 NOTE — Assessment & Plan Note (Signed)
generalzied OA, placed on pain contract for hydcrodone, refer to Dr. Eduard Clos for injections

## 2011-10-09 NOTE — Assessment & Plan Note (Signed)
Well controlled, no change to meds 

## 2011-10-25 ENCOUNTER — Telehealth: Payer: Self-pay | Admitting: Family Medicine

## 2011-10-28 ENCOUNTER — Ambulatory Visit: Payer: PRIVATE HEALTH INSURANCE | Admitting: Family Medicine

## 2011-11-02 ENCOUNTER — Other Ambulatory Visit: Payer: Self-pay

## 2011-11-02 MED ORDER — OMEPRAZOLE 20 MG PO CPDR: 20.0000 mg | DELAYED_RELEASE_CAPSULE | Freq: Every day | ORAL | Status: DC

## 2011-11-02 MED ORDER — AMLODIPINE BESYLATE 5 MG PO TABS: 5.0000 mg | ORAL_TABLET | Freq: Every day | ORAL | Status: DC

## 2011-11-02 MED ORDER — BUPROPION HCL ER (SR) 150 MG PO TB12: 150.0000 mg | ORAL_TABLET | Freq: Two times a day (BID) | ORAL | Status: DC

## 2011-11-02 NOTE — Telephone Encounter (Signed)
meds reordered

## 2012-02-10 ENCOUNTER — Other Ambulatory Visit: Payer: Self-pay | Admitting: Family Medicine

## 2012-02-24 ENCOUNTER — Encounter: Payer: Self-pay | Admitting: Family Medicine

## 2012-02-24 ENCOUNTER — Ambulatory Visit (INDEPENDENT_AMBULATORY_CARE_PROVIDER_SITE_OTHER): Payer: PRIVATE HEALTH INSURANCE | Admitting: Family Medicine

## 2012-02-24 VITALS — BP 130/88 | HR 56 | Resp 16 | Ht 65.0 in | Wt 179.0 lb

## 2012-02-24 DIAGNOSIS — M159 Polyosteoarthritis, unspecified: Secondary | ICD-10-CM | POA: Insufficient documentation

## 2012-02-24 DIAGNOSIS — M545 Low back pain, unspecified: Secondary | ICD-10-CM

## 2012-02-24 DIAGNOSIS — E785 Hyperlipidemia, unspecified: Secondary | ICD-10-CM

## 2012-02-24 DIAGNOSIS — F17200 Nicotine dependence, unspecified, uncomplicated: Secondary | ICD-10-CM

## 2012-02-24 DIAGNOSIS — I10 Essential (primary) hypertension: Secondary | ICD-10-CM

## 2012-02-24 MED ORDER — HYDROCODONE-ACETAMINOPHEN 5-325 MG PO TABS: 1.0000 | ORAL_TABLET | Freq: Three times a day (TID) | ORAL | Status: DC | PRN

## 2012-02-24 NOTE — Progress Notes (Signed)
  Subjective:    Patient ID: Tammy Austin, female    DOB: 1950-08-20, 61 y.o.   MRN: [id number]  HPI  Patient here to follow chronic medical problems. She has no specific concerns. She's asked for refill on her pain medicine she is following with Dr. but they have has had epidural injections but they have not helped very much therefore they're planning to send her to what appears to be a neurosurgeon.  She's taken her medications for today she is taking multivitamin as well as calcium  Review of Systems  GEN- denies fatigue, fever, weight loss,weakness, recent illness HEENT- denies eye drainage, change in vision, nasal discharge, CVS- denies chest pain, palpitations RESP- denies SOB, cough, wheeze ABD- denies N/V, change in stools, abd pain GU- denies dysuria, hematuria, dribbling, incontinence MSK- + joint pain, muscle aches, injury Neuro- denies headache, dizziness, syncope, seizure activity      Objective:   Physical Exam GEN- NAD, alert and oriented x3 HEENT- PERRL, EOMI, non injected sclera, pink conjunctiva, MMM, oropharynx clear Neck- Supple, no thryomegaly CVS- mild bradycardia HR 60, no murmur RESP-CTAB EXT- No edema Pulses- Radial, DP- 2+        Assessment & Plan:

## 2012-02-24 NOTE — Assessment & Plan Note (Signed)
She is on pain contract we'll refill Vicodin 60 tablets

## 2012-02-24 NOTE — Assessment & Plan Note (Signed)
Chronic back pain she is followed by Dr. but they have for this it appears she may be seen a neurosurgeon I will await the last note ,pain contract for hydrocodone

## 2012-02-24 NOTE — Assessment & Plan Note (Signed)
Review cholesterol again with patient today she is working on diet to improve this. She will need to have this rechecked in 4 months

## 2012-02-24 NOTE — Assessment & Plan Note (Signed)
Well-controlled no change the medication 

## 2012-02-24 NOTE — Assessment & Plan Note (Signed)
She is down to using approximately one cigarette a day she uses a vapor cigarette as well

## 2012-02-24 NOTE — Patient Instructions (Signed)
Continue current medication Pain medication to be restarted  Continue blood pressure medication F/U 4 months

## 2012-05-01 IMAGING — CR DG CHEST 1V PORT
1 series · 1 of 1 positions shown · non-contrast
Comparison: 05/19/2006

CLINICAL DATA: Chest pain today.  Headache [DATE] half months.

PORTABLE CHEST - 1 VIEW

[view not recorded]
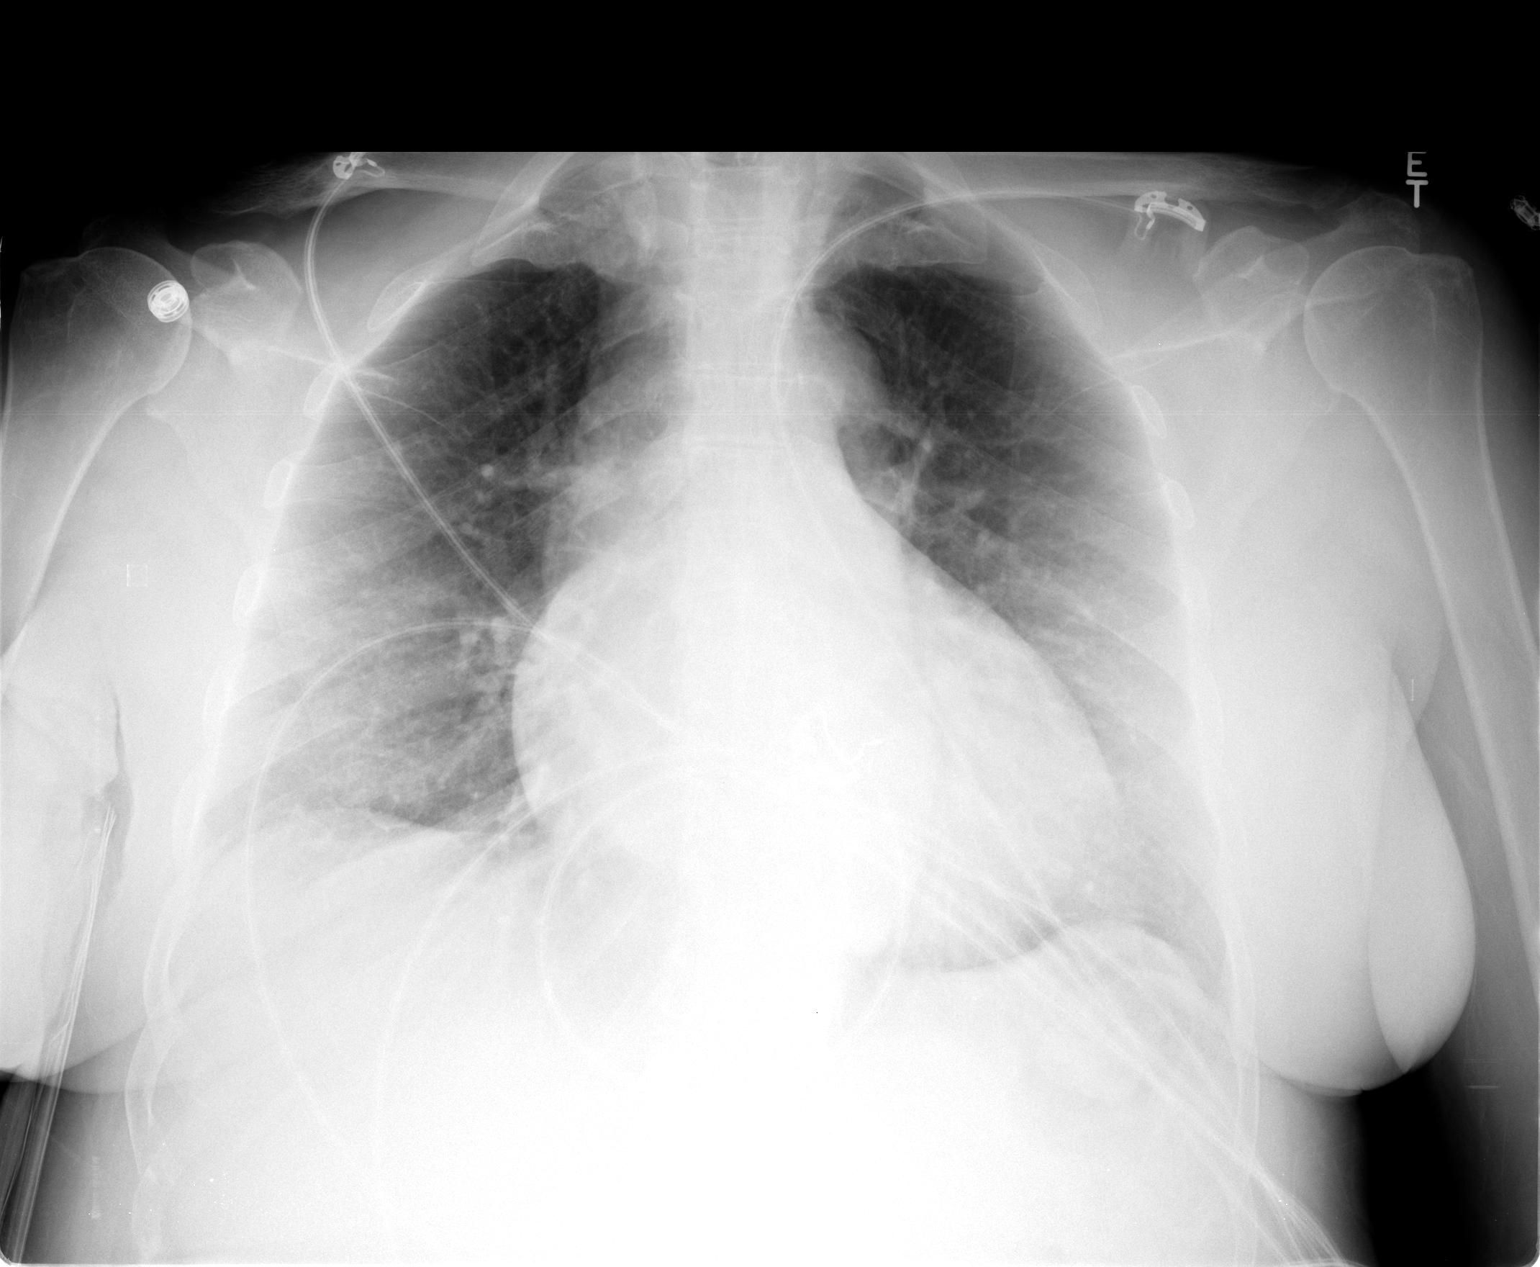

[1 of 1 positions shown; findings below may reference images not displayed]

FINDINGS: Cardiac enlargement with normal pulmonary vascularity.
No focal airspace consolidation in the lungs.  No blunting of
costophrenic angles.  Slightly shallow inspiration.  No significant
changes since the prior study.
IMPRESSION: Cardiac enlargement.  No focal lung consolidation or effusion.

## 2012-05-15 ENCOUNTER — Encounter: Payer: Self-pay | Admitting: *Deleted

## 2012-05-21 ENCOUNTER — Other Ambulatory Visit: Payer: Self-pay | Admitting: Family Medicine

## 2012-06-05 ENCOUNTER — Telehealth: Payer: Self-pay | Admitting: Family Medicine

## 2012-06-05 MED ORDER — HYDROCODONE-ACETAMINOPHEN 5-325 MG PO TABS: 1.0000 | ORAL_TABLET | Freq: Three times a day (TID) | ORAL | Status: DC | PRN

## 2012-06-05 MED ORDER — NAPROXEN 500 MG PO TABS: 500.0000 mg | ORAL_TABLET | Freq: Two times a day (BID) | ORAL | Status: DC

## 2012-06-05 NOTE — Telephone Encounter (Signed)
Tried to call her back to see if she meant the pain med but no answer. Do you know what she is requesting?

## 2012-06-05 NOTE — Telephone Encounter (Signed)
Both meds refilled 

## 2012-06-12 ENCOUNTER — Encounter: Payer: Self-pay | Admitting: Family Medicine

## 2012-06-29 ENCOUNTER — Ambulatory Visit: Payer: PRIVATE HEALTH INSURANCE | Admitting: Family Medicine

## 2012-06-29 ENCOUNTER — Ambulatory Visit: Payer: Self-pay | Admitting: Family Medicine

## 2012-07-03 ENCOUNTER — Ambulatory Visit: Payer: Self-pay | Admitting: Family Medicine

## 2012-07-03 ENCOUNTER — Encounter: Payer: Self-pay | Admitting: Family Medicine

## 2012-07-03 ENCOUNTER — Ambulatory Visit (INDEPENDENT_AMBULATORY_CARE_PROVIDER_SITE_OTHER): Payer: PRIVATE HEALTH INSURANCE | Admitting: Family Medicine

## 2012-07-03 VITALS — BP 120/80 | HR 80 | Temp 97.5°F | Resp 18 | Ht 65.0 in | Wt 174.0 lb

## 2012-07-03 DIAGNOSIS — I10 Essential (primary) hypertension: Secondary | ICD-10-CM

## 2012-07-03 DIAGNOSIS — M545 Low back pain, unspecified: Secondary | ICD-10-CM

## 2012-07-03 DIAGNOSIS — M159 Polyosteoarthritis, unspecified: Secondary | ICD-10-CM

## 2012-07-03 DIAGNOSIS — E785 Hyperlipidemia, unspecified: Secondary | ICD-10-CM

## 2012-07-03 DIAGNOSIS — Z1231 Encounter for screening mammogram for malignant neoplasm of breast: Secondary | ICD-10-CM

## 2012-07-03 DIAGNOSIS — K219 Gastro-esophageal reflux disease without esophagitis: Secondary | ICD-10-CM

## 2012-07-03 DIAGNOSIS — F17200 Nicotine dependence, unspecified, uncomplicated: Secondary | ICD-10-CM

## 2012-07-03 MED ORDER — AMLODIPINE BESYLATE 5 MG PO TABS: ORAL_TABLET | ORAL | Status: DC

## 2012-07-03 MED ORDER — HYDROCODONE-ACETAMINOPHEN 5-325 MG PO TABS: 1.0000 | ORAL_TABLET | Freq: Three times a day (TID) | ORAL | Status: DC | PRN

## 2012-07-03 MED ORDER — OMEPRAZOLE 20 MG PO CPDR: DELAYED_RELEASE_CAPSULE | ORAL | Status: DC

## 2012-07-03 MED ORDER — NAPROXEN 500 MG PO TABS: 500.0000 mg | ORAL_TABLET | Freq: Two times a day (BID) | ORAL | Status: DC

## 2012-07-03 NOTE — Assessment & Plan Note (Signed)
Check FLP 

## 2012-07-03 NOTE — Assessment & Plan Note (Signed)
Encourage cessation, she is using electronic cig

## 2012-07-03 NOTE — Patient Instructions (Signed)
Continue current medications Labs today  We will call with results of labs Mammogram to be scheduled F/U 4 months

## 2012-07-03 NOTE — Assessment & Plan Note (Signed)
BP stable, recheck fasting labs

## 2012-07-03 NOTE — Assessment & Plan Note (Signed)
Improved with injections, pain meds refilled

## 2012-07-03 NOTE — Assessment & Plan Note (Signed)
Doing well on PPI. 

## 2012-07-03 NOTE — Assessment & Plan Note (Signed)
Hydrocodone and naprosyn

## 2012-07-03 NOTE — Progress Notes (Signed)
  Subjective:    Patient ID: Tammy Austin, female    DOB: 11/08/1950, 62 y.o.   MRN: [id number]  HPI  Pt here to f/u chronic medical problems. She has no specific concerns. She quit smoking 2 days ago using but they've for cigarette. Her back pain is much improved status post injection she uses the hydrocodone as needed been no longer daily. She is due for repeat fasting labs for her hyperlipidemia. Acid reflux is well-controlled on prilosec Medications reviewed  Review of Systems  GEN- denies fatigue, fever, weight loss,weakness, recent illness HEENT- denies eye drainage, change in vision, nasal discharge, CVS- denies chest pain, palpitations RESP- denies SOB, cough, wheeze ABD- denies N/V, change in stools, abd pain GU- denies dysuria, hematuria, dribbling, incontinence MSK- denies joint pain, muscle aches, injury Neuro- denies headache, dizziness, syncope, seizure activity      Objective:   Physical Exam GEN- NAD, alert and oriented x3 HEENT- PERRL, EOMI, non injected sclera, pink conjunctiva, MMM, oropharynx clear Neck- Supple,  CVS- RRR, no murmur RESP-CTAB ABD-NABS,soft,NT,ND EXT- No edema Pulses- Radial, DP- 2+        Assessment & Plan:

## 2012-07-04 LAB — CBC
Hemoglobin: 12.2 g/dL (ref 12.0–15.0)
MCHC: 33.5 g/dL (ref 30.0–36.0)
WBC: 5 10*3/uL (ref 4.0–10.5)

## 2012-07-04 LAB — COMPREHENSIVE METABOLIC PANEL
ALT: 15 U/L (ref 0–35)
AST: 12 U/L (ref 0–37)
Albumin: 3.9 g/dL (ref 3.5–5.2)
Alkaline Phosphatase: 74 U/L (ref 39–117)
Glucose, Bld: 90 mg/dL (ref 70–99)
Potassium: 4.2 mEq/L (ref 3.5–5.3)
Sodium: 140 mEq/L (ref 135–145)
Total Bilirubin: 0.3 mg/dL (ref 0.3–1.2)
Total Protein: 6.6 g/dL (ref 6.0–8.3)

## 2012-07-04 LAB — LIPID PANEL
LDL Cholesterol: 114 mg/dL — ABNORMAL HIGH (ref 0–99)
Triglycerides: 126 mg/dL (ref <150)
VLDL: 25 mg/dL (ref 0–40)

## 2012-07-04 MED ORDER — HYDROCODONE-ACETAMINOPHEN 5-325 MG PO TABS: 1.0000 | ORAL_TABLET | Freq: Three times a day (TID) | ORAL | Status: DC | PRN

## 2012-07-04 NOTE — Addendum Note (Signed)
Addended by: Milinda Antis F on: 07/04/2012 07:42 AM   Modules accepted: Orders

## 2012-07-16 ENCOUNTER — Telehealth: Payer: Self-pay | Admitting: Family Medicine

## 2012-07-16 NOTE — Telephone Encounter (Signed)
Please call and triage her, she has not been seen for any abdominal pain recently. She is on prilosec for GERD Schedule appt if needed

## 2012-07-16 NOTE — Telephone Encounter (Signed)
Can we prescribe her something

## 2012-07-17 NOTE — Telephone Encounter (Signed)
LMTRC

## 2012-07-19 NOTE — Telephone Encounter (Signed)
LMTRC

## 2012-07-24 NOTE — Telephone Encounter (Signed)
Pt is still having abdominal pain, pt has appt July 21

## 2012-08-06 ENCOUNTER — Ambulatory Visit (INDEPENDENT_AMBULATORY_CARE_PROVIDER_SITE_OTHER): Payer: PRIVATE HEALTH INSURANCE | Admitting: Family Medicine

## 2012-08-06 VITALS — BP 120/80 | HR 68 | Temp 98.8°F | Resp 18 | Wt 172.0 lb

## 2012-08-06 DIAGNOSIS — R1032 Left lower quadrant pain: Secondary | ICD-10-CM

## 2012-08-06 DIAGNOSIS — K5730 Diverticulosis of large intestine without perforation or abscess without bleeding: Secondary | ICD-10-CM

## 2012-08-06 DIAGNOSIS — K5790 Diverticulosis of intestine, part unspecified, without perforation or abscess without bleeding: Secondary | ICD-10-CM

## 2012-08-06 NOTE — Patient Instructions (Addendum)
Call if the pain returns You can eat fiber rich foods and drink plenty of water   Diverticulitis Small pockets or "bubbles" can develop in the wall of the intestine. Diverticulitis is when those pockets become infected and inflamed. This causes stomach pain (usually on the left side). HOME CARE  Take all medicine as told by your doctor.  Try a clear liquid diet (broth, tea, or water) for as long as told by your doctor.  Keep all follow-up visits with your doctor.  You may be put on a low-fiber diet once you start feeling better. Here are foods that have low-fiber:  White breads, cereals, rice, and pasta.  Cooked fruits and vegetables or soft fresh fruits and vegetables without the skin.  Ground or well-cooked tender beef, ham, veal, lamb, pork, or poultry.  Eggs and seafood.  After you are doing well on the low-fiber diet, you may be put on a high-fiber diet. Here are ways to increase your fiber:  Choose whole-grain breads, cereals, pasta, and brown rice.  Choose fruits and vegetables with skin on. Do not overcook the vegetables.  Choose nuts, seeds, legumes, dried peas, beans, and lentils.  Look for food products that have more than 3 grams of fiber per serving on the food label. GET HELP RIGHT AWAY IF:  Your pain does not get better or gets worse.  You have trouble eating food.  You are not pooping (having bowel movements) like normal.  You have a temperature by mouth above 102 F (38.9 C), not controlled by medicine.  You keep throwing up (vomiting).  You have bloody or black, tarry poop (stools).  You are getting worse and not better. MAKE SURE YOU:   Understand these instructions.  Will watch your condition.  Will get help right away if you are not doing well or get worse. Document Released: 06/22/2007 Document Revised: 03/28/2011 Document Reviewed: 11/24/2008 [facility] Patient Information 2014 [place], [place].

## 2012-08-07 ENCOUNTER — Encounter: Payer: Self-pay | Admitting: Family Medicine

## 2012-08-07 DIAGNOSIS — K5790 Diverticulosis of intestine, part unspecified, without perforation or abscess without bleeding: Secondary | ICD-10-CM | POA: Insufficient documentation

## 2012-08-07 DIAGNOSIS — R1032 Left lower quadrant pain: Secondary | ICD-10-CM | POA: Insufficient documentation

## 2012-08-07 NOTE — Progress Notes (Signed)
  Subjective:    Patient ID: Tammy Austin, female    DOB: [birth year], 62 y.o.   MRN: [id number]  HPI  About 3 weeks ago patient called in with left-sided severe abdominal pain. However she did not go to the emergency room or come in to the office. It felt like her previous diverticular flares. She change her diet and used stool softeners and resolved after 3 days. A few days ago she had another episode of soreness in her left side not as severe as previous no nausea vomiting fever no change in stools. It resolved that day. She currently has no abdominal pain no change in stools. She does state that yesterday she took all of her medication twice that she forgot to take it that morning and she had some loose stools but that is now resolved.  Review of Systems - per above  GEN- denies fatigue, fever, weight loss,weakness, recent illness CVS- denies chest pain, palpitations RESP- denies SOB, cough, wheeze ABD- denies N/V, change in stools, abd pain GU- denies dysuria, hematuria, dribbling, incontinencey        Objective:   Physical Exam   GEN- NAD, alert and oriented x3 HEENT- PERRL, EOMI, non injected sclera, pink conjunctiva, MMM, oropharynx clear CVS- RRR, no murmur RESP-CTAB ABD-NABS,soft,NT,ND, no CVA tenderness EXT- No edema Pulses- Radial 2+       Assessment & Plan:

## 2012-08-07 NOTE — Assessment & Plan Note (Signed)
She was given a handout on diverticular disease. At this point she can go back to her high fiber diet. Advised her she begins having pain she should go to a clear liquid diet and call for further instructions

## 2012-08-07 NOTE — Assessment & Plan Note (Signed)
Her left lower quadrant pain was likely due to diverticulitis. She has no symptoms of UTI. Her exam is benign at this time. She's advised that she has recurrent pain to call the office if the symptoms of the same we'll start her in treatment with Cipro Flagyl for diverticulitis flare.

## 2012-08-13 ENCOUNTER — Ambulatory Visit (HOSPITAL_COMMUNITY): Payer: PRIVATE HEALTH INSURANCE

## 2012-08-13 ENCOUNTER — Ambulatory Visit (HOSPITAL_COMMUNITY)
Admission: RE | Admit: 2012-08-13 | Discharge: 2012-08-13 | Disposition: A | Discharge status: Home / Self Care | Payer: PRIVATE HEALTH INSURANCE | Source: Ambulatory Visit | Attending: Family Medicine | Admitting: Family Medicine

## 2012-08-13 DIAGNOSIS — Z1231 Encounter for screening mammogram for malignant neoplasm of breast: Secondary | ICD-10-CM

## 2012-08-23 ENCOUNTER — Telehealth: Payer: Self-pay | Admitting: Family Medicine

## 2012-08-23 NOTE — Telephone Encounter (Signed)
Pt states you would give her penicillin if her pains continue she is wanting to know if she can have it now.

## 2012-08-24 ENCOUNTER — Telehealth: Payer: Self-pay | Admitting: Family Medicine

## 2012-08-24 MED ORDER — CIPROFLOXACIN HCL 500 MG PO TABS: 500.0000 mg | ORAL_TABLET | Freq: Two times a day (BID) | ORAL | Status: DC

## 2012-08-24 MED ORDER — METRONIDAZOLE 500 MG PO TABS: 500.0000 mg | ORAL_TABLET | Freq: Three times a day (TID) | ORAL | Status: DC

## 2012-08-24 NOTE — Telephone Encounter (Signed)
She has diverticulitis, go on clear liquid diet Start Cipro and Flagyl, sent to pharmacy Okay to take her pain medication F/U Monday for recheck If pain worsens she gets vomiting or high fever go to ER

## 2012-08-24 NOTE — Telephone Encounter (Signed)
Called pt back she was confused about appts, she is to come in Monday to see dr. Jeanice Austin

## 2012-08-24 NOTE — Telephone Encounter (Signed)
Pt aware of results 

## 2012-08-27 ENCOUNTER — Encounter: Payer: Self-pay | Admitting: Family Medicine

## 2012-08-27 ENCOUNTER — Ambulatory Visit (INDEPENDENT_AMBULATORY_CARE_PROVIDER_SITE_OTHER): Payer: PRIVATE HEALTH INSURANCE | Admitting: Family Medicine

## 2012-08-27 VITALS — BP 130/80 | HR 68 | Temp 97.5°F | Resp 18 | Wt 178.0 lb

## 2012-08-27 DIAGNOSIS — K5792 Diverticulitis of intestine, part unspecified, without perforation or abscess without bleeding: Secondary | ICD-10-CM | POA: Insufficient documentation

## 2012-08-27 DIAGNOSIS — K5732 Diverticulitis of large intestine without perforation or abscess without bleeding: Secondary | ICD-10-CM

## 2012-08-27 NOTE — Assessment & Plan Note (Signed)
Continue current treatment Cipro/Flagyl Pain improved Check BMET, CBC w diff If her pain worsens despite treatment obtain CT abd/pelvis

## 2012-08-27 NOTE — Patient Instructions (Signed)
Complete antibiotics Call if you worsen or develop fever, nausea or vomiting  F/U as previous

## 2012-08-27 NOTE — Progress Notes (Signed)
  Subjective:    Patient ID: Tammy Austin, female    DOB: 1950/04/27, 62 y.o.   MRN: [id number]  HPI  Pt here to f/u diverticulitis, Friday started on Cipro/flagyl due to recurrence of LLQ pain with some loose stools. Now stools normal, tolerating regular diet, she did not decrease to liquids as advised. Noticed stools were  A little dark but denies any black tarry stools or BRBPR. Pain much improved today  Review of Systems  GEN- denies fatigue, fever, weight loss,weakness, recent illness HEENT- denies eye drainage, change in vision, nasal discharge, CVS- denies chest pain, palpitations RESP- denies SOB, cough, wheeze ABD- denies N/V, +change in stools, abd pain GU- denies dysuria, hematuria, dribbling, incontinence        Objective:   Physical Exam GEN- NAD, alert and oriented x3 HEENT- PERRL, EOMI, non injected sclera, pink conjunctiva, MMM, oropharynx clear CVS- RRR, no murmur RESP-CTAB ABD-NABS,soft, mild TPP, LLQ, no rebound, no mass felt EXT- No edema Pulses- Radial 2+        Assessment & Plan:

## 2012-08-28 LAB — BASIC METABOLIC PANEL
Glucose, Bld: 103 mg/dL — ABNORMAL HIGH (ref 70–99)
Potassium: 4.2 mEq/L (ref 3.5–5.3)
Sodium: 141 mEq/L (ref 135–145)

## 2012-08-28 LAB — CBC WITH DIFFERENTIAL/PLATELET
Basophils Absolute: 0 10*3/uL (ref 0.0–0.1)
Basophils Relative: 1 % (ref 0–1)
Hemoglobin: 13.1 g/dL (ref 12.0–15.0)
MCHC: 33.8 g/dL (ref 30.0–36.0)
Monocytes Relative: 14 % — ABNORMAL HIGH (ref 3–12)
Neutro Abs: 0.9 10*3/uL — ABNORMAL LOW (ref 1.7–7.7)
Neutrophils Relative %: 27 % — ABNORMAL LOW (ref 43–77)

## 2012-08-29 ENCOUNTER — Other Ambulatory Visit: Payer: Self-pay | Admitting: Family Medicine

## 2012-08-29 DIAGNOSIS — D72819 Decreased white blood cell count, unspecified: Secondary | ICD-10-CM

## 2012-11-02 ENCOUNTER — Encounter: Payer: Self-pay | Admitting: Family Medicine

## 2012-11-02 ENCOUNTER — Ambulatory Visit (INDEPENDENT_AMBULATORY_CARE_PROVIDER_SITE_OTHER): Payer: PRIVATE HEALTH INSURANCE | Admitting: Family Medicine

## 2012-11-02 VITALS — BP 116/80 | HR 78 | Temp 98.0°F | Resp 16

## 2012-11-02 DIAGNOSIS — D72819 Decreased white blood cell count, unspecified: Secondary | ICD-10-CM

## 2012-11-02 DIAGNOSIS — F17200 Nicotine dependence, unspecified, uncomplicated: Secondary | ICD-10-CM

## 2012-11-02 DIAGNOSIS — Z23 Encounter for immunization: Secondary | ICD-10-CM

## 2012-11-02 DIAGNOSIS — M159 Polyosteoarthritis, unspecified: Secondary | ICD-10-CM

## 2012-11-02 DIAGNOSIS — I10 Essential (primary) hypertension: Secondary | ICD-10-CM

## 2012-11-02 DIAGNOSIS — G5600 Carpal tunnel syndrome, unspecified upper limb: Secondary | ICD-10-CM

## 2012-11-02 MED ORDER — HYDROCODONE-ACETAMINOPHEN 5-325 MG PO TABS: 1.0000 | ORAL_TABLET | Freq: Three times a day (TID) | ORAL | Status: DC | PRN

## 2012-11-02 MED ORDER — GABAPENTIN 300 MG PO CAPS: 300.0000 mg | ORAL_CAPSULE | Freq: Every day | ORAL | Status: DC

## 2012-11-02 NOTE — Patient Instructions (Addendum)
Continue current medications Use the gabapentin at bedtime for carpal tunnel Give it 6 weeks, if not better referral othopedic surgeon Flu shot given F/U 4 months

## 2012-11-03 LAB — CBC WITH DIFFERENTIAL/PLATELET
Eosinophils Relative: 2 % (ref 0–5)
HCT: 39.1 % (ref 36.0–46.0)
Hemoglobin: 13.2 g/dL (ref 12.0–15.0)
Lymphocytes Relative: 46 % (ref 12–46)
Lymphs Abs: 2.3 10*3/uL (ref 0.7–4.0)
MCV: 95.4 fL (ref 78.0–100.0)
Monocytes Absolute: 0.3 10*3/uL (ref 0.1–1.0)
Monocytes Relative: 7 % (ref 3–12)
RBC: 4.1 MIL/uL (ref 3.87–5.11)
WBC: 4.9 10*3/uL (ref 4.0–10.5)

## 2012-11-04 ENCOUNTER — Encounter: Payer: Self-pay | Admitting: Family Medicine

## 2012-11-04 NOTE — Progress Notes (Signed)
  Subjective:    Patient ID: Tammy Austin, female    DOB: 1950-10-01, 62 y.o.   MRN: [id number]  HPI  Pt here f/u chronic medical problems. Continues to have difficulty with her carpal tunnel. Has more tingling in her left hand. Has to shake her hands out. She does have braces at home which she wears sometimes. Has a dropped items a few times. Continues to work on tobacco cessation Medications reviewed Due for flu shot    Review of Systems - per above  GEN- denies fatigue, fever, weight loss,weakness, recent illness HEENT- denies eye drainage, change in vision, nasal discharge, CVS- denies chest pain, palpitations RESP- denies SOB, cough, wheeze ABD- denies N/V, change in stools, abd pain GU- denies dysuria, hematuria, dribbling, incontinence MSK- denies joint pain, muscle aches, injury Neuro- denies headache, dizziness, syncope, seizure activity      Objective:   Physical Exam GEN- NAD, alert and oriented x3 HEENT- PERRL, EOMI, non injected sclera, pink conjunctiva, MMM, oropharynx clear Neck- Supple, neg spurlings CVS- RRR, no murmur RESP-CTAB EXT- No edema Pulses- Radial 2+ Neuro- CNII-XII in tact, no focal deficits, +phalens, neg tinels bilat       Assessment & Plan:

## 2012-11-04 NOTE — Assessment & Plan Note (Signed)
Discussed the carpal tunnel and need for intervention. She remembers having conduction studies done years ago and does not want at this time Will have her wear braces on regular basis, add gabapentin at bedtime Advised that she will most likley need either injections or surgery, she wants to hold at this time

## 2012-11-04 NOTE — Assessment & Plan Note (Signed)
Continue meds. 

## 2012-11-04 NOTE — Assessment & Plan Note (Signed)
Well controlled on norvasc 

## 2012-11-04 NOTE — Assessment & Plan Note (Signed)
Continue to work on tobacco cessation.  

## 2012-12-24 ENCOUNTER — Other Ambulatory Visit: Payer: Self-pay | Admitting: Family Medicine

## 2012-12-28 ENCOUNTER — Telehealth: Payer: Self-pay | Admitting: Family Medicine

## 2012-12-28 NOTE — Telephone Encounter (Signed)
Pt is calling because her hand is till hurting she has been taking the medication and she has been wearing her brace but it is not getting any better and she is wanting to know if there is something else that she can get  Pharmacy is [facility] in [place] Call back number is [phone]

## 2013-01-02 ENCOUNTER — Other Ambulatory Visit: Payer: Self-pay | Admitting: Family Medicine

## 2013-02-05 ENCOUNTER — Encounter (HOSPITAL_COMMUNITY): Payer: Self-pay | Admitting: Emergency Medicine

## 2013-02-05 ENCOUNTER — Emergency Department (HOSPITAL_COMMUNITY)
Admission: EM | Admit: 2013-02-05 | Discharge: 2013-02-05 | Disposition: A | Discharge status: Home / Self Care | Payer: PRIVATE HEALTH INSURANCE | Attending: Emergency Medicine | Admitting: Emergency Medicine

## 2013-02-05 DIAGNOSIS — L02811 Cutaneous abscess of head [any part, except face]: Secondary | ICD-10-CM

## 2013-02-05 DIAGNOSIS — K219 Gastro-esophageal reflux disease without esophagitis: Secondary | ICD-10-CM | POA: Insufficient documentation

## 2013-02-05 DIAGNOSIS — L03818 Cellulitis of other sites: Secondary | ICD-10-CM

## 2013-02-05 DIAGNOSIS — M1990 Unspecified osteoarthritis, unspecified site: Secondary | ICD-10-CM | POA: Insufficient documentation

## 2013-02-05 DIAGNOSIS — Z79899 Other long term (current) drug therapy: Secondary | ICD-10-CM | POA: Insufficient documentation

## 2013-02-05 DIAGNOSIS — L02411 Cutaneous abscess of right axilla: Secondary | ICD-10-CM

## 2013-02-05 DIAGNOSIS — IMO0002 Reserved for concepts with insufficient information to code with codable children: Secondary | ICD-10-CM | POA: Insufficient documentation

## 2013-02-05 DIAGNOSIS — F17200 Nicotine dependence, unspecified, uncomplicated: Secondary | ICD-10-CM | POA: Insufficient documentation

## 2013-02-05 DIAGNOSIS — I10 Essential (primary) hypertension: Secondary | ICD-10-CM | POA: Insufficient documentation

## 2013-02-05 DIAGNOSIS — Z8669 Personal history of other diseases of the nervous system and sense organs: Secondary | ICD-10-CM | POA: Insufficient documentation

## 2013-02-05 DIAGNOSIS — L02818 Cutaneous abscess of other sites: Secondary | ICD-10-CM | POA: Insufficient documentation

## 2013-02-05 MED ORDER — SULFAMETHOXAZOLE-TMP DS 800-160 MG PO TABS
1.0000 | ORAL_TABLET | Freq: Once | ORAL | Status: AC
  Administered 2013-02-05: 1 via ORAL
  Filled 2013-02-05: qty 1

## 2013-02-05 MED ORDER — SULFAMETHOXAZOLE-TRIMETHOPRIM 800-160 MG PO TABS: 1.0000 | ORAL_TABLET | Freq: Two times a day (BID) | ORAL | Status: DC

## 2013-02-05 NOTE — ED Notes (Signed)
Pt has boils under both armpits x couple of weeks, one on the right is draining now.

## 2013-02-05 NOTE — ED Provider Notes (Signed)
CSN: [id number]     Arrival date & time 02/05/13  84 History   First MD Initiated Contact with Patient 02/05/13 1920     Chief Complaint  Patient presents with  . Recurrent Skin Infections   (Consider location/radiation/quality/duration/timing/severity/associated sxs/prior Treatment) HPI..... Boils right axilla, left breast, scalp for 2 weeks. Most of the boils will eventually rupture with a small amount of pus. No fever or chills. No history of diabetes. Severity is mild.  Palpation makes pain worse.  Past Medical History  Diagnosis Date  . Hypertension   . Reflux   . Diverticular disease   . Arthritis     generalized OA  . Carpal tunnel syndrome    Past Surgical History  Procedure Laterality Date  . Abdominal hysterectomy    . Tonsillectomy    . Cholecystectomy     Family History  Problem Relation Age of Onset  . Arthritis Mother   . Diabetes Father   . Hypertension Sister   . Depression Paternal Uncle    History  Substance Use Topics  . Smoking status: Current Some Day Smoker  . Smokeless tobacco: Not on file     Comment: trying to quit with electronic cigarette  . Alcohol Use: Yes   OB History   Grav Para Term Preterm Abortions TAB SAB Ect Mult Living                 Review of Systems  All other systems reviewed and are negative.    Allergies  Ibuprofen  Home Medications   Current Outpatient Rx  Name  Route  Sig  Dispense  Refill  . amLODipine (NORVASC) 5 MG tablet      TAKE ONE TABLET BY MOUTH ONCE DAILY   30 tablet   1   . gabapentin (NEURONTIN) 300 MG capsule   Oral   Take 1 capsule (300 mg total) by mouth at bedtime.   30 capsule   3   . HYDROcodone-acetaminophen (NORCO/VICODIN) 5-325 MG per tablet   Oral   Take 1 tablet by mouth every 8 (eight) hours as needed for pain.   60 tablet   0   . multivitamin-iron-minerals-folic acid (CENTRUM) chewable tablet   Oral   Chew 1 tablet by mouth daily.         Marland Kitchen omeprazole (PRILOSEC) 20  MG capsule      TAKE ONE CAPSULE BY MOUTH ONCE DAILY   30 capsule   1   . senna-docusate (SENOKOT-S) 8.6-50 MG per tablet   Oral   Take 4-5 tablets by mouth daily.         Marland Kitchen sulfamethoxazole-trimethoprim (SEPTRA DS) 800-160 MG per tablet   Oral   Take 1 tablet by mouth 2 (two) times daily.   20 tablet   0    BP 146/85  Pulse 77  Resp 18  Ht 5\' 5"  (1.651 m)  Wt 172 lb (78.019 kg)  BMI 28.62 kg/m2  SpO2 97% Physical Exam  Nursing note and vitals reviewed. Constitutional: She is oriented to person, place, and time. She appears well-developed and well-nourished.  HENT:  Head: Normocephalic and atraumatic.  Eyes: Conjunctivae and EOM are normal. Pupils are equal, round, and reactive to light.  Neck: Normal range of motion. Neck supple.  Cardiovascular: Normal rate, regular rhythm and normal heart sounds.   Pulmonary/Chest: Effort normal and breath sounds normal.  Abdominal: Soft. Bowel sounds are normal.  Musculoskeletal: Normal range of motion.  Neurological: She is alert and  oriented to person, place, and time.  Skin:  Small boils right axilla, scalp.   Healing boil on left breast.  Psychiatric: She has a normal mood and affect. Her behavior is normal.    ED Course  Procedures (including critical care time) Labs Review Labs Reviewed - No data to display Imaging Review No results found.  EKG Interpretation   None       MDM   1. Abscess of axilla, right   2. Abscess, scalp    Patient has several small abscess'.   No incision and drainage necessary. Rx Septra DS. Patient has primary care followup.   Nat Christen, MD 02/05/13 2053

## 2013-02-05 NOTE — Discharge Instructions (Signed)
Abscess An abscess (boil or furuncle) is an infected area on or under the skin. This area is filled with yellowish-white fluid (pus) and other material (debris). HOME CARE   Only take medicines as told by your doctor.  If you were given antibiotic medicine, take it as directed. Finish the medicine even if you start to feel better.  If gauze is used, follow your doctor's directions for changing the gauze.  To avoid spreading the infection:  Keep your abscess covered with a bandage.  Wash your hands well.  Do not share personal care items, towels, or whirlpools with others.  Avoid skin contact with others.  Keep your skin and clothes clean around the abscess.  Keep all doctor visits as told. GET HELP RIGHT AWAY IF:   You have more pain, puffiness (swelling), or redness in the wound site.  You have more fluid or blood coming from the wound site.  You have muscle aches, chills, or you feel sick.  You have a fever. MAKE SURE YOU:   Understand these instructions.  Will watch your condition.  Will get help right away if you are not doing well or get worse. Document Released: 06/22/2007 Document Revised: 07/05/2011 Document Reviewed: 03/18/2011 [facility] Patient Information 2014 [place].   Use antibacterial soap and shampoo.   Antibiotic twice a day.

## 2013-02-28 ENCOUNTER — Encounter (HOSPITAL_COMMUNITY): Payer: Self-pay | Admitting: Emergency Medicine

## 2013-02-28 ENCOUNTER — Emergency Department (HOSPITAL_COMMUNITY)
Admission: EM | Admit: 2013-02-28 | Discharge: 2013-02-28 | Disposition: A | Discharge status: Home / Self Care | Payer: PRIVATE HEALTH INSURANCE | Attending: Emergency Medicine | Admitting: Emergency Medicine

## 2013-02-28 DIAGNOSIS — F17200 Nicotine dependence, unspecified, uncomplicated: Secondary | ICD-10-CM | POA: Insufficient documentation

## 2013-02-28 DIAGNOSIS — Z79899 Other long term (current) drug therapy: Secondary | ICD-10-CM | POA: Insufficient documentation

## 2013-02-28 DIAGNOSIS — J029 Acute pharyngitis, unspecified: Secondary | ICD-10-CM | POA: Insufficient documentation

## 2013-02-28 DIAGNOSIS — Z792 Long term (current) use of antibiotics: Secondary | ICD-10-CM | POA: Insufficient documentation

## 2013-02-28 DIAGNOSIS — Z8669 Personal history of other diseases of the nervous system and sense organs: Secondary | ICD-10-CM | POA: Insufficient documentation

## 2013-02-28 DIAGNOSIS — M1990 Unspecified osteoarthritis, unspecified site: Secondary | ICD-10-CM | POA: Insufficient documentation

## 2013-02-28 DIAGNOSIS — K219 Gastro-esophageal reflux disease without esophagitis: Secondary | ICD-10-CM | POA: Insufficient documentation

## 2013-02-28 DIAGNOSIS — I10 Essential (primary) hypertension: Secondary | ICD-10-CM | POA: Insufficient documentation

## 2013-02-28 LAB — RAPID STREP SCREEN (MED CTR MEBANE ONLY): STREPTOCOCCUS, GROUP A SCREEN (DIRECT): NEGATIVE

## 2013-02-28 MED ORDER — DEXAMETHASONE SODIUM PHOSPHATE 4 MG/ML IJ SOLN
8.0000 mg | Freq: Once | INTRAMUSCULAR | Status: AC
  Administered 2013-02-28: 8 mg via INTRAMUSCULAR
  Filled 2013-02-28: qty 2

## 2013-02-28 NOTE — ED Notes (Signed)
Sore throat since yesterday,  No fever, cough.  "just my throat"

## 2013-02-28 NOTE — ED Provider Notes (Signed)
CSN: [id number]     Arrival date & time 02/28/13  1807 History   First MD Initiated Contact with Patient 02/28/13 1815     Chief Complaint  Patient presents with  . Sore Throat     (Consider location/radiation/quality/duration/timing/severity/associated sxs/prior Treatment) HPI Comments: Patient is 63 year old female with history of HTN, Reflux and diverticular disease who presents to the ED with a day history of sore throat, she states that this started last night, she has been trying cough drops and sore throat lozenges without relief.  She denies fever, chills, cough, congestion, inability to open her mouth, ear pain, headache, body aches.  She states that her sister has a cold and her mother is admitted here with pneumonia so she became concerned.  Patient is a 63 y.o. female presenting with pharyngitis. The history is provided by the patient. No language interpreter was used.  Sore Throat This is a new problem. The current episode started yesterday. The problem occurs constantly. The problem has been gradually worsening. Associated symptoms include a sore throat. Pertinent negatives include no abdominal pain, arthralgias, chest pain, chills, coughing, fever, headaches, myalgias, nausea, neck pain, swollen glands, urinary symptoms or vomiting. The symptoms are aggravated by swallowing. She has tried nothing for the symptoms. The treatment provided no relief.    Past Medical History  Diagnosis Date  . Hypertension   . Reflux   . Diverticular disease   . Arthritis     generalized OA  . Carpal tunnel syndrome    Past Surgical History  Procedure Laterality Date  . Abdominal hysterectomy    . Tonsillectomy    . Cholecystectomy     Family History  Problem Relation Age of Onset  . Arthritis Mother   . Diabetes Father   . Hypertension Sister   . Depression Paternal Uncle    History  Substance Use Topics  . Smoking status: Current Some Day Smoker  . Smokeless tobacco: Not on file      Comment: trying to quit with electronic cigarette  . Alcohol Use: Yes   OB History   Grav Para Term Preterm Abortions TAB SAB Ect Mult Living                 Review of Systems  Constitutional: Negative for fever and chills.  HENT: Positive for sore throat.   Respiratory: Negative for cough.   Cardiovascular: Negative for chest pain.  Gastrointestinal: Negative for nausea, vomiting and abdominal pain.  Musculoskeletal: Negative for arthralgias, myalgias and neck pain.  Neurological: Negative for headaches.  All other systems reviewed and are negative.      Allergies  Ibuprofen  Home Medications   Current Outpatient Rx  Name  Route  Sig  Dispense  Refill  . amLODipine (NORVASC) 5 MG tablet      TAKE ONE TABLET BY MOUTH ONCE DAILY   30 tablet   1   . gabapentin (NEURONTIN) 300 MG capsule   Oral   Take 1 capsule (300 mg total) by mouth at bedtime.   30 capsule   3   . HYDROcodone-acetaminophen (NORCO/VICODIN) 5-325 MG per tablet   Oral   Take 1 tablet by mouth every 8 (eight) hours as needed for pain.   60 tablet   0   . multivitamin-iron-minerals-folic acid (CENTRUM) chewable tablet   Oral   Chew 1 tablet by mouth daily.         Marland Kitchen omeprazole (PRILOSEC) 20 MG capsule  TAKE ONE CAPSULE BY MOUTH ONCE DAILY   30 capsule   1   . senna-docusate (SENOKOT-S) 8.6-50 MG per tablet   Oral   Take 4-5 tablets by mouth daily.         Marland Kitchen sulfamethoxazole-trimethoprim (SEPTRA DS) 800-160 MG per tablet   Oral   Take 1 tablet by mouth 2 (two) times daily.   20 tablet   0    BP 136/83  Pulse 86  Temp(Src) 98.1 F (36.7 C) (Oral)  Resp 20  Ht 5\' 5"  (1.651 m)  Wt 175 lb (79.379 kg)  BMI 29.12 kg/m2  SpO2 97% Physical Exam  Nursing note and vitals reviewed. Constitutional: She is oriented to person, place, and time. She appears well-developed and well-nourished. No distress.  HENT:  Head: Normocephalic and atraumatic.  Right Ear: External ear  normal.  Left Ear: External ear normal.  Nose: Nose normal.  Mouth/Throat: No oropharyngeal exudate.  Mild posterior pharyngeal erythema without exudates  Eyes: Conjunctivae are normal. Pupils are equal, round, and reactive to light. No scleral icterus.  Neck: Normal range of motion. Neck supple.  Cardiovascular: Normal rate, regular rhythm and normal heart sounds.  Exam reveals no gallop and no friction rub.   No murmur heard. Pulmonary/Chest: Effort normal and breath sounds normal. No respiratory distress. She has no wheezes. She has no rales. She exhibits no tenderness.  Abdominal: Soft. Bowel sounds are normal. She exhibits no distension. There is no tenderness. There is no rebound and no guarding.  Musculoskeletal: Normal range of motion. She exhibits no edema and no tenderness.  Lymphadenopathy:    She has no cervical adenopathy.  Neurological: She is alert and oriented to person, place, and time. She exhibits normal muscle tone. Coordination normal.  Skin: Skin is warm and dry. No rash noted. No erythema. No pallor.  Psychiatric: She has a normal mood and affect. Her behavior is normal. Judgment and thought content normal.    ED Course  Procedures (including critical care time) Labs Review Labs Reviewed  RAPID STREP SCREEN   Imaging Review No results found.  EKG Interpretation   None      Results for orders placed during the hospital encounter of 02/28/13  RAPID STREP SCREEN      Result Value Ref Range   Streptococcus, Group A Screen (Direct) NEGATIVE  NEGATIVE   No results found.  Medications  dexamethasone (DECADRON) injection 8 mg (8 mg Intramuscular Given 02/28/13 1840)     MDM   Viral pharyngitis  Patient here with sore throat since yesterday - no other symptoms, strep negative, doubt influenza, PTA, ludwigs angina, will treat with conservative treatments, including salt water gargles and throat lozenges  Joaquim Lai C. Joelyn Oms, [place] 02/28/13 1912

## 2013-02-28 NOTE — ED Provider Notes (Signed)
Medical screening examination/treatment/procedure(s) were performed by non-physician practitioner and as supervising physician I was immediately available for consultation/collaboration.  EKG Interpretation   None         Pamula Luther L Trista Ciocca, MD 02/28/13 2142 

## 2013-02-28 NOTE — Discharge Instructions (Signed)
Antibiotic Resistance Antibiotics are drugs. They fight infections caused by bacteria. Antibiotics greatly reduce illness and death from infectious diseases. Over time, the bacteria that antibiotics once controlled are much harder to kill. CAUSES  Antibiotic resistance occurs when bacteria change in some way. These changes can lessen the abilities of drugs designed to cure infections. The over-use of antibiotics can cause antibiotic resistance. Almost all important bacterial infections in the world are becoming resistant to drugs. Antibiotic resistance has been called one of the world's most pressing public health problems.  Antibiotics should be used to treat bacterial infections. But they are not effective against viral infections. These include the common cold, most sore throats, and the flu. Smart use of antibiotics will control the spread of resistance.  TREATMENT   Only use antibiotics as prescribed by your caregiver.  Talk with your caregiver about antibiotic resistance.  Ask what else you can do to feel better.  Do not take an antibiotic for a viral infection. This could be a cold, cough or the flu.  Do not save some of your antibiotic for the next time you get sick.  Take an antibiotic exactly as the caregiver tells you.  Do not take an antibiotic that is prescribed for someone else.  Use the antibiotic as directed. Take the correct dose at the scheduled time. SEEK MEDICAL CARE IF:  You react to the antibiotic with:  A rash.  Itching.  An upset stomach. Document Released: 03/26/2002 Document Revised: 03/28/2011 Document Reviewed: 10/29/2007 [facility] Patient Information 2014 [place].  Antibiotic Nonuse  Your caregiver felt that the infection or problem was not one that would be helped with an antibiotic. Infections may be caused by viruses or bacteria. Only a caregiver can tell which one of these is the likely cause of an illness. A cold is the most common cause of  infection in both adults and children. A cold is a virus. Antibiotic treatment will have no effect on a viral infection. Viruses can lead to many lost days of work caring for sick children and many missed days of school. Children may catch as many as 10 "colds" or "flus" per year during which they can be tearful, cranky, and uncomfortable. The goal of treating a virus is aimed at keeping the ill person comfortable. Antibiotics are medications used to help the body fight bacterial infections. There are relatively few types of bacteria that cause infections but there are hundreds of viruses. While both viruses and bacteria cause infection they are very different types of germs. A viral infection will typically go away by itself within 7 to 10 days. Bacterial infections may spread or get worse without antibiotic treatment. Examples of bacterial infections are:  Sore throats (like strep throat or tonsillitis).  Infection in the lung (pneumonia).  Ear and skin infections. Examples of viral infections are:  Colds or flus.  Most coughs and bronchitis.  Sore throats not caused by Strep.  Runny noses. It is often best not to take an antibiotic when a viral infection is the cause of the problem. Antibiotics can kill off the helpful bacteria that we have inside our body and allow harmful bacteria to start growing. Antibiotics can cause side effects such as allergies, nausea, and diarrhea without helping to improve the symptoms of the viral infection. Additionally, repeated uses of antibiotics can cause bacteria inside of our body to become resistant. That resistance can be passed onto harmful bacterial. The next time you have an infection it may be  harder to treat if antibiotics are used when they are not needed. Not treating with antibiotics allows our own immune system to develop and take care of infections more efficiently. Also, antibiotics will work better for [place] when they are prescribed for bacterial  infections. Treatments for a child that is ill may include:  Give extra fluids throughout the day to stay hydrated.  Get plenty of rest.  Only give your child over-the-counter or prescription medicines for pain, discomfort, or fever as directed by your caregiver.  The use of a cool mist humidifier may help stuffy noses.  Cold medications if suggested by your caregiver. Your caregiver may decide to start you on an antibiotic if:  The problem you were seen for today continues for a longer length of time than expected.  You develop a secondary bacterial infection. SEEK MEDICAL CARE IF:  Fever lasts longer than 5 days.  Symptoms continue to get worse after 5 to 7 days or become severe.  Difficulty in breathing develops.  Signs of dehydration develop (poor drinking, rare urinating, dark colored urine).  Changes in behavior or worsening tiredness (listlessness or lethargy). Document Released: 03/14/2001 Document Revised: 03/28/2011 Document Reviewed: 09/10/2008 [facility] Patient Information 2014 [place], [place].  Pharyngitis Pharyngitis is redness, pain, and swelling (inflammation) of your pharynx.  CAUSES  Pharyngitis is usually caused by infection. Most of the time, these infections are from viruses (viral) and are part of a cold. However, sometimes pharyngitis is caused by bacteria (bacterial). Pharyngitis can also be caused by allergies. Viral pharyngitis may be spread from person to person by coughing, sneezing, and personal items or utensils (cups, forks, spoons, toothbrushes). Bacterial pharyngitis may be spread from person to person by more intimate contact, such as kissing.  SIGNS AND SYMPTOMS  Symptoms of pharyngitis include:   Sore throat.   Tiredness (fatigue).   Low-grade fever.   Headache.  Joint pain and muscle aches.  Skin rashes.  Swollen lymph nodes.  Plaque-like film on throat or tonsils (often seen with bacterial pharyngitis). DIAGNOSIS  Your  health care provider will ask you questions about your illness and your symptoms. Your medical history, along with a physical exam, is often all that is needed to diagnose pharyngitis. Sometimes, a rapid strep test is done. Other lab tests may also be done, depending on the suspected cause.  TREATMENT  Viral pharyngitis will usually get better in 3 4 days without the use of medicine. Bacterial pharyngitis is treated with medicines that kill germs (antibiotics).  HOME CARE INSTRUCTIONS   Drink enough water and fluids to keep your urine clear or pale yellow.   Only take over-the-counter or prescription medicines as directed by your health care provider:   If you are prescribed antibiotics, make sure you finish them even if you start to feel better.   Do not take aspirin.   Get lots of rest.   Gargle with 8 oz of salt water ( tsp of salt per 1 qt of water) as often as every 1 2 hours to soothe your throat.   Throat lozenges (if you are not at risk for choking) or sprays may be used to soothe your throat. SEEK MEDICAL CARE IF:   You have large, tender lumps in your neck.  You have a rash.  You cough up green, yellow-brown, or bloody spit. SEEK IMMEDIATE MEDICAL CARE IF:   Your neck becomes stiff.  You drool or are unable to swallow liquids.  You vomit or are unable to keep  medicines or liquids down.  You have severe pain that does not go away with the use of recommended medicines.  You have trouble breathing (not caused by a stuffy nose). MAKE SURE YOU:   Understand these instructions.  Will watch your condition.  Will get help right away if you are not doing well or get worse. Document Released: 01/03/2005 Document Revised: 10/24/2012 Document Reviewed: 09/10/2012 [facility] Patient Information 2014 [place].  Salt Water Gargle This solution will help make your mouth and throat feel better. HOME CARE INSTRUCTIONS   Mix 1 teaspoon of salt in 8 ounces of warm  water.  Gargle with this solution as much or often as you need or as directed. Swish and gargle gently if you have any sores or wounds in your mouth.  Do not swallow this mixture. Document Released: 10/08/2003 Document Revised: 03/28/2011 Document Reviewed: 02/29/2008 [facility] Patient Information 2014 [place].

## 2013-02-28 NOTE — ED Notes (Signed)
Pt c/o sore throat since last night. 

## 2013-03-02 LAB — CULTURE, GROUP A STREP

## 2013-03-04 ENCOUNTER — Telehealth: Payer: Self-pay | Admitting: *Deleted

## 2013-03-04 NOTE — Telephone Encounter (Signed)
Call in zpak Continue cough drops Salt water gargles

## 2013-03-04 NOTE — Telephone Encounter (Signed)
Call placed to pt to reschedule appointment d/t weather.   Appointment rescheduled for next week.   Patient states that she is sick and requires ABT. Requested F/U call.

## 2013-03-04 NOTE — Telephone Encounter (Signed)
Pt has sore throat, nasal congestion and a cough her appt had to be cancelled d/t to weather, wants to know if you can call antibiotic in.

## 2013-03-05 ENCOUNTER — Ambulatory Visit: Payer: PRIVATE HEALTH INSURANCE | Admitting: Family Medicine

## 2013-03-06 MED ORDER — AZITHROMYCIN 250 MG PO TABS: ORAL_TABLET | ORAL | Status: DC

## 2013-03-06 NOTE — Telephone Encounter (Signed)
Meds refilled.

## 2013-03-07 ENCOUNTER — Other Ambulatory Visit: Payer: Self-pay | Admitting: Family Medicine

## 2013-03-07 NOTE — Telephone Encounter (Signed)
Ok to fill naprosyn? Medication has not been on list.

## 2013-03-08 NOTE — Telephone Encounter (Signed)
Okay to refill all meds

## 2013-03-13 ENCOUNTER — Encounter: Payer: Self-pay | Admitting: Family Medicine

## 2013-03-13 ENCOUNTER — Ambulatory Visit (INDEPENDENT_AMBULATORY_CARE_PROVIDER_SITE_OTHER): Payer: PRIVATE HEALTH INSURANCE | Admitting: Family Medicine

## 2013-03-13 VITALS — BP 128/78 | HR 76 | Temp 98.4°F | Resp 20 | Ht 63.5 in | Wt 180.5 lb

## 2013-03-13 DIAGNOSIS — M545 Low back pain, unspecified: Secondary | ICD-10-CM

## 2013-03-13 DIAGNOSIS — D229 Melanocytic nevi, unspecified: Secondary | ICD-10-CM

## 2013-03-13 DIAGNOSIS — D239 Other benign neoplasm of skin, unspecified: Secondary | ICD-10-CM

## 2013-03-13 DIAGNOSIS — F17200 Nicotine dependence, unspecified, uncomplicated: Secondary | ICD-10-CM

## 2013-03-13 DIAGNOSIS — G5600 Carpal tunnel syndrome, unspecified upper limb: Secondary | ICD-10-CM

## 2013-03-13 DIAGNOSIS — L989 Disorder of the skin and subcutaneous tissue, unspecified: Secondary | ICD-10-CM

## 2013-03-13 DIAGNOSIS — I10 Essential (primary) hypertension: Secondary | ICD-10-CM

## 2013-03-13 MED ORDER — GABAPENTIN 300 MG PO CAPS: 300.0000 mg | ORAL_CAPSULE | Freq: Two times a day (BID) | ORAL | Status: DC

## 2013-03-13 MED ORDER — HYDROCODONE-ACETAMINOPHEN 10-325 MG PO TABS: 1.0000 | ORAL_TABLET | Freq: Four times a day (QID) | ORAL | Status: DC | PRN

## 2013-03-13 MED ORDER — CYCLOBENZAPRINE HCL 10 MG PO TABS: 10.0000 mg | ORAL_TABLET | Freq: Three times a day (TID) | ORAL | Status: DC | PRN

## 2013-03-13 NOTE — Assessment & Plan Note (Addendum)
Referral for nerve conduction studies Increase gabapentin to 300mg  BID

## 2013-03-13 NOTE — Patient Instructions (Signed)
Referral to Neurology for nerve conduction Increase gabapentin to twice a day  Muscle for your back Hydrocodone made stronger Call if back is not better- MRI will be done Get mucinex or robitussin for the congestion F/U 4 months

## 2013-03-15 DIAGNOSIS — D229 Melanocytic nevi, unspecified: Secondary | ICD-10-CM | POA: Insufficient documentation

## 2013-03-15 DIAGNOSIS — L989 Disorder of the skin and subcutaneous tissue, unspecified: Secondary | ICD-10-CM | POA: Insufficient documentation

## 2013-03-15 NOTE — Addendum Note (Signed)
Addended by: Vic Blackbird F on: 03/15/2013 03:56 PM   Modules accepted: Orders

## 2013-03-15 NOTE — Assessment & Plan Note (Signed)
Chronic back pain with some mild radicular symptoms, will change her gabapentin and pain meds, increase activity She did have epidural injection with Dr. Ace Gins with some improvement, we made need new MRI to guide her treatment further, I will see has she does with exercises and medication changes first,

## 2013-03-15 NOTE — Assessment & Plan Note (Signed)
BP looks good, continue norvasc

## 2013-03-15 NOTE — Assessment & Plan Note (Signed)
She has many moles on her legs and upper extremities however she does have one large one on her back of her right leg that needs to be removed

## 2013-03-15 NOTE — Assessment & Plan Note (Addendum)
It appears to be a benign lesion on her nose however it is growing in size. I think dermatology needs to review this and have this removed

## 2013-03-15 NOTE — Progress Notes (Addendum)
Patient ID: [name], female   DOB: December 06, 1950, 63 y.o.   MRN: [id number]   Subjective:    Patient ID: Tammy Austin, female    DOB: 12/14/1950, 63 y.o.   MRN: [id number]  Patient presents for 4 month F/U, cold and carpal tunnel  Was recently treated for pharyngitis and URI, still has some mucous left but no fever and minimal cough. Carpal Tunnel, continues to have pain in both hands- using braces with some improvement, at times she has taken and extra gabapentin to help with pain, wants to proceed with next step   Back pain- chronic back pain due to OA, hydrocodone from last visit did not help very much, has been taking NSAIDS and tylenol as needed, occasional gets pain that runs down right leg, no change in bowel or bladder, has not been exercising much because of the weather She has a mole she would like to look at. The one on her nose has been growing over time and she would like to have this removed   Review Of Systems: GEN- denies fatigue, fever, weight loss,weakness, recent illness HEENT- denies eye drainage, change in vision, nasal discharge, CVS- denies chest pain, palpitations RESP- denies SOB, cough, wheeze ABD- denies N/V, change in stools, abd pain GU- denies dysuria, hematuria, dribbling, incontinence MSK-+ joint pain, muscle aches, injury Neuro- denies headache, dizziness, syncope, seizure activity         Objective:    BP 128/78  Pulse 76  Temp(Src) 98.4 F (36.9 C)  Resp 20  Ht 5' 3.5" (1.613 m)  Wt 180 lb 8 oz (81.874 kg)  BMI 31.47 kg/m2  GEN- NAD, alert and oriented x3 HEENT- PERRL, EOMI, non injected sclera, pink conjunctiva, MMM, oropharynx clear CVS- RRR, no murmur RESP-CTAB MSK- Spine NT, neg SLR, fair ROM HIPS, KNEES EXT- No edema Pulses- Radial 2+ Neuro- CNII-XII in tact, no focal deficits, +phalens, neg tinels bilat Skin- large flesh colored penduculated lesion on right nares, NT, multiple moles scattered, large lesion on left calf  region      Assessment & Plan:      Problem List Items Addressed This Visit   TOBACCO USER     She continues to cut back, continue vapor cigarette    Numerous moles     She has many moles on her legs and upper extremities however she does have one large one on her back of her right leg that needs to be removed    Relevant Orders      Ambulatory referral to Dermatology   LOW BACK PAIN, CHRONIC     Chronic back pain with some mild radicular symptoms, will change her gabapentin and pain meds, increase activity She did have epidural injection with Dr. Ace Gins with some improvement, we made need new MRI to guide her treatment further, I will see has she does with exercises and medication changes first,    Relevant Medications      cyclobenzaprine (FLEXERIL) tablet      HYDROCODONE-ACETAMINOPHEN 10-325 MG PO TABS   HYPERTENSION     BP looks good, continue norvasc    External nasal lesion     It appears to be a benign lesion on her nose however it is growing in size. I think dermatology needs to review this and have this removed    Relevant Orders      Ambulatory referral to Dermatology   CARPAL TUNNEL SYNDROME - Primary     Referral for nerve conduction studies  Increase gabapentin to 300mg  BID    Relevant Medications      gabapentin (NEURONTIN) capsule   Other Relevant Orders      Nerve conduction test      Note: This dictation was prepared with Dragon dictation along with smaller phrase technology. Any transcriptional errors that result from this process are unintentional.

## 2013-03-15 NOTE — Assessment & Plan Note (Signed)
She continues to cut back, continue vapor cigarette

## 2013-04-09 ENCOUNTER — Telehealth: Payer: Self-pay | Admitting: *Deleted

## 2013-04-09 MED ORDER — BUPROPION HCL ER (SR) 150 MG PO TB12: ORAL_TABLET | ORAL | Status: DC

## 2013-04-09 NOTE — Telephone Encounter (Signed)
Call placed to patient and patient made aware.  

## 2013-04-09 NOTE — Telephone Encounter (Signed)
Received fax from pharmacy requesting new medication to help patient quit smoking such as Wellbutrin.   Call placed to patient. States that she is requesting help with quitting.   MD please advise.

## 2013-04-09 NOTE — Telephone Encounter (Signed)
Start wellbutrin, as directed on bottle, will treat for 12 weeks total for smoking cessation

## 2013-04-10 ENCOUNTER — Encounter (INDEPENDENT_AMBULATORY_CARE_PROVIDER_SITE_OTHER): Payer: Self-pay

## 2013-04-10 ENCOUNTER — Ambulatory Visit (INDEPENDENT_AMBULATORY_CARE_PROVIDER_SITE_OTHER): Payer: PRIVATE HEALTH INSURANCE | Admitting: Neurology

## 2013-04-10 ENCOUNTER — Encounter (INDEPENDENT_AMBULATORY_CARE_PROVIDER_SITE_OTHER): Payer: Self-pay | Admitting: Radiology

## 2013-04-10 DIAGNOSIS — Z0289 Encounter for other administrative examinations: Secondary | ICD-10-CM

## 2013-04-10 DIAGNOSIS — G5600 Carpal tunnel syndrome, unspecified upper limb: Secondary | ICD-10-CM

## 2013-04-10 NOTE — Procedures (Signed)
     HISTORY:  Tammy Austin is a 63 year old patient with a history of numbness and discomfort in the hands that dates back for 5 years. The patient continues to have ongoing symptoms, and she is being reevaluated for possible carpal tunnel syndrome. The patient denies any neck discomfort or pain radiating down the arms from the neck.  NERVE CONDUCTION STUDIES:  Nerve conduction studies were performed on both upper extremities. The distal motor latencies for the median nerves were prolonged bilaterally, with a low motor amplitude on the right, normal motor amplitude on the left. The distal motor latencies and motor amplitudes for the ulnar nerves were normal bilaterally. The F wave latencies for the median nerves were prolonged on the right, normal on the left, and normal for the ulnar nerves bilaterally. The nerve conduction velocities were normal for the right median nerve, slowed for the left median nerve, and normal for the ulnar nerves bilaterally. The sensory latencies for the median nerves were unobtainable bilaterally, normal for the ulnar nerves bilaterally.  EMG STUDIES:  EMG study was performed on the left upper extremity:  The first dorsal interosseous muscle reveals 2 to 5 K units with full recruitment. No fibrillations or positive waves were noted. The abductor pollicis brevis muscle reveals 2 to 4  K units with full recruitment. No fibrillations or positive waves were noted. The extensor indicis proprius muscle reveals 1 to 3 K units with full recruitment. No fibrillations or positive waves were noted. The pronator teres muscle reveals 2 to 3 K units with full recruitment. No fibrillations or positive waves were noted. The biceps muscle reveals 1 to 2 K units with full recruitment. No fibrillations or positive waves were noted. The triceps muscle reveals 2 to 4 K units with full recruitment. No fibrillations or positive waves were noted. The anterior deltoid muscle reveals 2 to 3  K units with full recruitment. No fibrillations or positive waves were noted. The cervical paraspinal muscles were tested at 2 levels. No abnormalities of insertional activity were seen at either level tested. There was good relaxation.  A limited EMG study was performed on the right upper extremity:  The first dorsal interosseous muscle reveals 2 to 4 K units with full recruitment. No fibrillations or positive waves were noted. The abductor pollicis brevis muscle reveals 2 to 4 K units with decreased recruitment. No fibrillations or positive waves were noted. The extensor indicis proprius muscle reveals 1 to 3 K units with full recruitment. No fibrillations or positive waves were noted.   IMPRESSION:  Nerve conduction studies done on both upper extremities shows evidence of bilateral carpal tunnel syndrome of moderate severity on the right, and mild severity on the left. EMG evaluation of the left upper extremity was unremarkable, without evidence of an overlying cervical radiculopathy. EMG evaluation of the right upper extremity was limited, but revealed findings consistent with carpal tunnel syndrome. In comparison to a prior study that was done on 03/08/2012, there has been some progression in severity of the carpal tunnel syndrome on both sides on today's evaluation.  Jill Alexanders MD 04/10/2013 4:25 PM  [facility] [address] [place], [place] [address]  Phone [phone] Fax [phone]

## 2013-07-01 ENCOUNTER — Telehealth: Payer: Self-pay | Admitting: *Deleted

## 2013-07-01 DIAGNOSIS — G5600 Carpal tunnel syndrome, unspecified upper limb: Secondary | ICD-10-CM

## 2013-07-01 NOTE — Telephone Encounter (Signed)
Call placed to patient and patient made aware.   Orders placed.

## 2013-07-01 NOTE — Telephone Encounter (Signed)
Message copied by Sheral Flow on Mon Jul 01, 2013  9:17 AM ------      Message from: Vic Blackbird F      Created: Fri Jun 28, 2013  4:01 PM       If she wants to stay in [place] she can go to Dr Aline Brochure, or she can go to [facility] to place referral- Orthopedics-  Carpal tunnel, make sure nerve conduction study from March sent with referral       ----- Message -----         From: Eden Lathe Ulani Degrasse, LPN         Sent: 0/25/4270   2:22 PM           To: Alycia Rossetti, MD                        ----- Message -----         From: Devoria Glassing         Sent: 06/26/2013   2:16 PM           To: Eden Lathe Daphne Karrer, LPN            Patient said that dr Buelah Manis recommended a hand surgeon for her and she does not remember the name of the surgeon       [phone]       ------

## 2013-07-12 ENCOUNTER — Ambulatory Visit (INDEPENDENT_AMBULATORY_CARE_PROVIDER_SITE_OTHER): Payer: PRIVATE HEALTH INSURANCE | Admitting: Family Medicine

## 2013-07-12 ENCOUNTER — Encounter: Payer: Self-pay | Admitting: Family Medicine

## 2013-07-12 VITALS — BP 122/68 | HR 64 | Temp 98.1°F | Resp 12 | Ht 64.0 in | Wt 184.0 lb

## 2013-07-12 DIAGNOSIS — M545 Low back pain, unspecified: Secondary | ICD-10-CM

## 2013-07-12 DIAGNOSIS — F17200 Nicotine dependence, unspecified, uncomplicated: Secondary | ICD-10-CM

## 2013-07-12 DIAGNOSIS — G5600 Carpal tunnel syndrome, unspecified upper limb: Secondary | ICD-10-CM

## 2013-07-12 DIAGNOSIS — I10 Essential (primary) hypertension: Secondary | ICD-10-CM

## 2013-07-12 DIAGNOSIS — E785 Hyperlipidemia, unspecified: Secondary | ICD-10-CM

## 2013-07-12 MED ORDER — GABAPENTIN 300 MG PO CAPS: ORAL_CAPSULE | ORAL | Status: DC

## 2013-07-12 MED ORDER — OXYCODONE-ACETAMINOPHEN 5-325 MG PO TABS: 1.0000 | ORAL_TABLET | Freq: Three times a day (TID) | ORAL | Status: DC | PRN

## 2013-07-12 MED ORDER — BUPROPION HCL ER (SR) 150 MG PO TB12: ORAL_TABLET | ORAL | Status: DC

## 2013-07-12 MED ORDER — NAPROXEN 500 MG PO TABS: ORAL_TABLET | ORAL | Status: DC

## 2013-07-12 NOTE — Patient Instructions (Signed)
Stop the aleve Your gabapentin changed to 2 at bedtime and 1 in the morning  We will call with lab results Take naprosyn twice a day for inflammation New pain medication  F/U 4 months

## 2013-07-12 NOTE — Assessment & Plan Note (Signed)
Gabapentin increased to 600 mg at bedtime she will continue 300 during the day. Work on weight loss as well

## 2013-07-12 NOTE — Assessment & Plan Note (Signed)
Check fasting labs 

## 2013-07-12 NOTE — Assessment & Plan Note (Signed)
Blood pressure looks good continue current

## 2013-07-12 NOTE — Progress Notes (Signed)
Patient ID: Tammy Austin, female   DOB: 1950/11/29, 63 y.o.   MRN: [id number]   Subjective:    Patient ID: Tammy Austin, female    DOB: 1950/07/31, 63 y.o.   MRN: [id number]  Patient presents for 4 month F/U and Hand pain  patient here to follow chronic medical problems and for fasting labs. She schedules the orthopedic secondary to severe carpal tunnel syndrome. She states the medications are not helping very much with her pain. She does take a pain pill all along with anti-inflammatories only gets minimal response. She's also taking the gabapentin does not seem a lot of benefit from this. 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 Over the past few weeks she's had increase in her low back pain which is chronic. She is known degenerative disc disease in her back as well as arthritis. She's not had any radiating symptoms and her pain is mostly on the left side. There's been no change in bowel or bladder. Medications reviewed    Review Of Systems:  GEN- denies fatigue, fever, weight loss,weakness, recent illness HEENT- denies eye drainage, change in vision, nasal discharge, CVS- denies chest pain, palpitations RESP- denies SOB, cough, wheeze ABD- denies N/V, change in stools, abd pain GU- denies dysuria, hematuria, dribbling, incontinence MSK-+ joint pain, muscle aches, injury Neuro- denies headache, dizziness, syncope, seizure activity       Objective:    BP 122/68  Pulse 58  Temp(Src) 98.1 F (36.7 C) (Oral)  Resp 12  Ht 5\' 4"  (1.626 m)  Wt 184 lb (83.462 kg)  BMI 31.57 kg/m2  GEN- NAD, alert and oriented x3 HEENT- PERRL, EOMI, non injected sclera, pink conjunctiva, MMM, oropharynx clear CVS- RRR, no murmur RESP-CTAB MSK- Spine NT, neg SLR, fair ROM HIPS, KNEES EXT- No edema Pulses- Radial 2+        Assessment & Plan:      Problem List Items Addressed This Visit   LOW BACK PAIN, CHRONIC   Relevant Medications      naproxen  (NAPROSYN) tablet      OXYCODONE-acetaminophen  5-325 mg po tabs   HYPERTENSION   Relevant Orders      CBC with Differential      Comprehensive metabolic panel   HYPERLIPIDEMIA - Primary   Relevant Orders      CBC with Differential      Comprehensive metabolic panel      Lipid panel   CARPAL TUNNEL SYNDROME   Relevant Medications      gabapentin (NEURONTIN) capsule      Note: This dictation was prepared with Dragon dictation along with smaller phrase technology. Any transcriptional errors that result from this process are unintentional.

## 2013-07-12 NOTE — Assessment & Plan Note (Signed)
She continues to improve with her tobacco use she was to continue the Wellbutrin

## 2013-07-12 NOTE — Assessment & Plan Note (Signed)
Gabapentin increased to 600 mg at bedtime continue 300 on the day of also change her to Percocet to help with her hands as well as her back. She will followup status post orthopedics which I think that she will need surgical intervention

## 2013-07-13 LAB — CBC WITH DIFFERENTIAL/PLATELET
BASOS PCT: 1 % (ref 0–1)
Basophils Absolute: 0.1 10*3/uL (ref 0.0–0.1)
EOS ABS: 0.2 10*3/uL (ref 0.0–0.7)
Eosinophils Relative: 4 % (ref 0–5)
HCT: 37.7 % (ref 36.0–46.0)
Hemoglobin: 12.7 g/dL (ref 12.0–15.0)
LYMPHS ABS: 2.6 10*3/uL (ref 0.7–4.0)
Lymphocytes Relative: 51 % — ABNORMAL HIGH (ref 12–46)
MCH: 32.1 pg (ref 26.0–34.0)
MCHC: 33.7 g/dL (ref 30.0–36.0)
MCV: 95.2 fL (ref 78.0–100.0)
MONOS PCT: 8 % (ref 3–12)
Monocytes Absolute: 0.4 10*3/uL (ref 0.1–1.0)
Neutro Abs: 1.8 10*3/uL (ref 1.7–7.7)
Neutrophils Relative %: 36 % — ABNORMAL LOW (ref 43–77)
Platelets: 344 10*3/uL (ref 150–400)
RBC: 3.96 MIL/uL (ref 3.87–5.11)
RDW: 13.5 % (ref 11.5–15.5)
WBC: 5.1 10*3/uL (ref 4.0–10.5)

## 2013-07-13 LAB — LIPID PANEL
CHOL/HDL RATIO: 3.3 ratio
Cholesterol: 209 mg/dL — ABNORMAL HIGH (ref 0–200)
HDL: 63 mg/dL (ref >39)
LDL Cholesterol: 127 mg/dL — ABNORMAL HIGH (ref 0–99)
TRIGLYCERIDES: 94 mg/dL (ref <150)
VLDL: 19 mg/dL (ref 0–40)

## 2013-07-13 LAB — COMPREHENSIVE METABOLIC PANEL
ALK PHOS: 88 U/L (ref 39–117)
ALT: 24 U/L (ref 0–35)
AST: 17 U/L (ref 0–37)
Albumin: 4.2 g/dL (ref 3.5–5.2)
BILIRUBIN TOTAL: 0.4 mg/dL (ref 0.2–1.2)
BUN: 7 mg/dL (ref 6–23)
CO2: 25 mEq/L (ref 19–32)
CREATININE: 0.83 mg/dL (ref 0.50–1.10)
Calcium: 9.5 mg/dL (ref 8.4–10.5)
Chloride: 105 mEq/L (ref 96–112)
GLUCOSE: 91 mg/dL (ref 70–99)
Potassium: 4.5 mEq/L (ref 3.5–5.3)
Sodium: 138 mEq/L (ref 135–145)
Total Protein: 6.9 g/dL (ref 6.0–8.3)

## 2013-08-06 ENCOUNTER — Ambulatory Visit (INDEPENDENT_AMBULATORY_CARE_PROVIDER_SITE_OTHER): Payer: PRIVATE HEALTH INSURANCE | Admitting: Orthopedic Surgery

## 2013-08-06 VITALS — BP 137/72 | Ht 64.0 in | Wt 180.0 lb

## 2013-08-06 DIAGNOSIS — G5603 Carpal tunnel syndrome, bilateral upper limbs: Secondary | ICD-10-CM | POA: Insufficient documentation

## 2013-08-06 DIAGNOSIS — G5600 Carpal tunnel syndrome, unspecified upper limb: Secondary | ICD-10-CM

## 2013-08-06 NOTE — Patient Instructions (Signed)
Surgery: Left carpal tunnel release July 31 We will call you with pre op appointment

## 2013-08-06 NOTE — Progress Notes (Signed)
Patient ID: Tammy Austin, female   DOB: [birth year], 63 y.o.   MRN: [id number] Chief Complaint  Patient presents with  . Carpal Tunnel    Carpal Tunnel Syndrome. Referred by Dr. Buelah Manis for consult.    63 years old 4 years symptoms both hands pain swelling catching stiffness numbness tingling Throbbing aching Constant 9/10 Nerve conduction studies positive bilateral carpal tunnel syndrome worse since the previous test. Previous treatment injection therapy and will leave gabapentin bracing Night pain weakness  Past Medical History  Diagnosis Date  . Hypertension   . Reflux   . Diverticular disease   . Arthritis     generalized OA  . Carpal tunnel syndrome    Past Surgical History  Procedure Laterality Date  . Abdominal hysterectomy    . Tonsillectomy    . Cholecystectomy     The past, family history and social history have been reviewed and are recorded in the corresponding sections of epic  VS BP 137/72  Ht 5\' 4"  (1.626 m)  Wt 180 lb (81.647 kg)  BMI 30.88 kg/m2  General and hygiene are normal. Development and nutrition are normal. Body habitus medium  Mood Affect are normal The patient is alert and oriented x3 Ambulatory status normal   RUE (include skin) Inspection reveals no abnormalities. Joint range of motion is within normal limits without contracture. There are no subluxations. Muscle tone is normal. Skin is warm dry and intact without rash lesion or ulceration.  Left and right upper extremity Tenderness over the volar aspect of the flexor retinaculum right worse than left. Normal range of motion. Normal wrist stability. Poor grip strength. Skin normal. Positive carpal tunnel compression test. Positive Phalen test. Decreased sensation fingertips median nerve distribution including half of the ring finger.   LLE Inspection reveals no abnormalities. Joint range of motion is within normal limits without contracture. There are no subluxations. Muscle tone is normal.  Skin is warm dry and intact without rash lesion or ulceration.  CDV peripheral pulses are intact without swelling or varicose veins  LYMPH are normal in all 4 extremities with no palpable nodes  DTR are equal and symmetric Balance  is normal  Bilateral carpal tunnel syndrome. Risks benefits explained. Left carpal tunnel release. 3-4 weeks later right carpal tunnel release.  Clearly explained to the patient only 85% chance of improvement because of longevity in severity of testing  Patient agrees to accept those risks and start with left carpal tunnel release

## 2013-08-07 ENCOUNTER — Other Ambulatory Visit: Payer: Self-pay | Admitting: *Deleted

## 2013-08-08 ENCOUNTER — Encounter (HOSPITAL_COMMUNITY): Payer: Self-pay | Admitting: Pharmacy Technician

## 2013-08-12 ENCOUNTER — Encounter (HOSPITAL_COMMUNITY)
Admission: RE | Admit: 2013-08-12 | Discharge: 2013-08-12 | Disposition: A | Discharge status: Home / Self Care | Payer: PRIVATE HEALTH INSURANCE | Source: Ambulatory Visit | Attending: Orthopedic Surgery | Admitting: Orthopedic Surgery

## 2013-08-12 ENCOUNTER — Other Ambulatory Visit: Payer: Self-pay

## 2013-08-12 ENCOUNTER — Encounter (HOSPITAL_COMMUNITY): Payer: Self-pay

## 2013-08-12 DIAGNOSIS — Z01818 Encounter for other preprocedural examination: Secondary | ICD-10-CM | POA: Diagnosis not present

## 2013-08-12 DIAGNOSIS — Z01812 Encounter for preprocedural laboratory examination: Secondary | ICD-10-CM | POA: Insufficient documentation

## 2013-08-12 DIAGNOSIS — Z01810 Encounter for preprocedural cardiovascular examination: Secondary | ICD-10-CM | POA: Diagnosis present

## 2013-08-12 HISTORY — DX: Gastro-esophageal reflux disease without esophagitis: K21.9

## 2013-08-12 HISTORY — DX: Anxiety disorder, unspecified: F41.9

## 2013-08-12 LAB — SURGICAL PCR SCREEN
MRSA, PCR: NEGATIVE
STAPHYLOCOCCUS AUREUS: NEGATIVE

## 2013-08-12 NOTE — Pre-Procedure Instructions (Addendum)
Patient in for PAT for left carpal tunnel. EKG shows Bigeminy with heart rate of 90. Patient has no history of cardiac issues except for HTN. Denies c/o sob, palpitations, lightheadedness, dizziness or other c/o. bp 140/80 sat 98%. Dr Patsey Berthold and Dr Aline Brochure aware of this. Surgery is postponed pending cardiac clearance. OR scheduling also notified. Copy of EKG faxed to [facility] at [facility].

## 2013-08-12 NOTE — Patient Instructions (Signed)
Tammy Austin  08/12/2013   Your procedure is scheduled on:  08/16/2013  Report to [facility] at  10  AM.  Call this number if you have problems the morning of surgery: [phone]   Remember:   Do not eat food or drink liquids after midnight.   Take these medicines the morning of surgery with A SIP OF WATER: amlodipine, wellbutrin, flexaril, nexium, neurontin   Do not wear jewelry, make-up or nail polish.  Do not wear lotions, powders, or perfumes.   Do not shave 48 hours prior to surgery. Men may shave face and neck.  Do not bring valuables to the hospital.  [facility] is not responsible for any belongings or valuables.               Contacts, dentures or bridgework may not be worn into surgery.  Leave suitcase in the car. After surgery it may be brought to your room.  For patients admitted to the hospital, discharge time is determined by your treatment team.               Patients discharged the day of surgery will not be allowed to drive home.  Name and phone number of your driver: family  Special Instructions: Shower using CHG 2 nights before surgery and the night before surgery.  If you shower the day of surgery use CHG.  Use special wash - you have one bottle of CHG for all showers.  You should use approximately 1/3 of the bottle for each shower.   Please read over the following fact sheets that you were given: Pain Booklet, Coughing and Deep Breathing, Surgical Site Infection Prevention, Anesthesia Post-op Instructions and Care and Recovery After Surgery Carpal Tunnel Release Carpal tunnel release is done to relieve the pressure on the nerves and tendons on the bottom side of your wrist.  LET YOUR CAREGIVER KNOW ABOUT:   Allergies to food or medicine.  Medicines taken, including vitamins, herbs, eyedrops, over-the-counter medicines, and creams.  Use of steroids (by mouth or creams).  Previous problems with anesthetics or numbing medicines.  History of bleeding  problems or blood clots.  Previous surgery.  Other health problems, including diabetes and kidney problems.  Possibility of pregnancy, if this applies. RISKS AND COMPLICATIONS  Some problems that may happen after this procedure include:  Infection.  Damage to the nerves, arteries or tendons could occur. This would be very uncommon.  Bleeding. BEFORE THE PROCEDURE   This surgery may be done while you are asleep (general anesthetic) or may be done under a block where only your forearm and the surgical area is numb.  If the surgery is done under a block, the numbness will gradually wear off within several hours after surgery. HOME CARE INSTRUCTIONS   Have a responsible person with you for 24 hours.  Do not drive a car or use public transportation for 24 hours.  Only take over-the-counter or prescription medicines for pain, discomfort, or fever as directed by your caregiver. Take them as directed.  You may put ice on the palm side of the affected wrist.  Put ice in a plastic bag.  Place a towel between your skin and the bag.  Leave the ice on for 20 to 30 minutes, 4 times per day.  If you were given a splint to keep your wrist from bending, use it as directed. It is important to wear the splint at night or as directed. Use the  splint for as long as you have pain or numbness in your hand, arm, or wrist. This may take 1 to 2 months.  Keep your hand raised (elevated) above the level of your heart as much as possible. This keeps swelling down and helps with discomfort.  Change bandages (dressings) as directed.  Keep the wound clean and dry. SEEK MEDICAL CARE IF:   You develop pain not relieved with medications.  You develop numbness of your hand.  You develop bleeding from your surgical site.  You have an oral temperature above 102 F (38.9 C).  You develop redness or swelling of the surgical site.  You develop new, unexplained problems. SEEK IMMEDIATE MEDICAL CARE  IF:   You develop a rash.  You have difficulty breathing.  You develop any reaction or side effects to medications given. Document Released: 03/26/2003 Document Revised: 03/28/2011 Document Reviewed: 11/09/2006 [facility] Patient Information 2015 [place], [place]. This information is not intended to replace advice given to you by your health care provider. Make sure you discuss any questions you have with your health care provider. Monitored Anesthesia Care Monitored anesthesia care is an anesthesia service for a medical procedure. Anesthesia is the loss of the ability to feel pain. It is produced by medicines called anesthetics. It may affect a small area of your body (local anesthesia), a large area of your body (regional anesthesia), or your entire body (general anesthesia). The need for monitored anesthesia care depends your procedure, your condition, and the potential need for regional or general anesthesia. It is often provided during procedures where:   General anesthesia may be needed if there are complications. This is because you need special care when you are under general anesthesia.   You will be under local or regional anesthesia. This is so that you are able to have higher levels of anesthesia if needed.   You will receive calming medicines (sedatives). This is especially the case if sedatives are given to put you in a semi-conscious state of relaxation (deep sedation). This is because the amount of sedative needed to produce this state can be hard to predict. Too much of a sedative can produce general anesthesia. Monitored anesthesia care is performed by one or more health care providers who have special training in all types of anesthesia. You will need to meet with these health care providers before your procedure. During this meeting, they will ask you about your medical history. They will also give you instructions to follow. (For example, you will need to stop eating and drinking  before your procedure. You may also need to stop or change medicines you are taking.) During your procedure, your health care providers will stay with you. They will:   Watch your condition. This includes watching your blood pressure, breathing, and level of pain.   Diagnose and treat problems that occur.   Give medicines if they are needed. These may include calming medicines (sedatives) and anesthetics.   Make sure you are comfortable.  Having monitored anesthesia care does not necessarily mean that you will be under anesthesia. It does mean that your health care providers will be able to manage anesthesia if you need it or if it occurs. It also means that you will be able to have a different type of anesthesia than you are having if you need it. When your procedure is complete, your health care providers will continue to watch your condition. They will make sure any medicines wear off before you are allowed to  go home.  Document Released: 09/29/2004 Document Revised: 05/20/2013 Document Reviewed: 02/15/2012 [facility] Patient Information 2015 [place], [place]. This information is not intended to replace advice given to you by your health care provider. Make sure you discuss any questions you have with your health care provider.

## 2013-08-16 ENCOUNTER — Ambulatory Visit (HOSPITAL_COMMUNITY)
Admission: RE | Admit: 2013-08-16 | Payer: PRIVATE HEALTH INSURANCE | Source: Ambulatory Visit | Admitting: Orthopedic Surgery

## 2013-08-16 ENCOUNTER — Encounter (HOSPITAL_COMMUNITY): Admission: RE | Payer: Self-pay | Source: Ambulatory Visit

## 2013-08-16 SURGERY — CARPAL TUNNEL RELEASE
Anesthesia: Choice | Laterality: Left

## 2013-08-19 ENCOUNTER — Other Ambulatory Visit: Payer: Self-pay | Admitting: Family Medicine

## 2013-08-19 ENCOUNTER — Ambulatory Visit: Payer: PRIVATE HEALTH INSURANCE | Admitting: Orthopedic Surgery

## 2013-08-19 NOTE — Telephone Encounter (Signed)
Refill appropriate and filled per protocol. 

## 2013-08-23 ENCOUNTER — Encounter: Payer: Self-pay | Admitting: Internal Medicine

## 2013-08-23 ENCOUNTER — Ambulatory Visit (INDEPENDENT_AMBULATORY_CARE_PROVIDER_SITE_OTHER): Payer: PRIVATE HEALTH INSURANCE | Admitting: Internal Medicine

## 2013-08-23 ENCOUNTER — Ambulatory Visit: Payer: PRIVATE HEALTH INSURANCE | Admitting: Internal Medicine

## 2013-08-23 ENCOUNTER — Encounter: Payer: Self-pay | Admitting: *Deleted

## 2013-08-23 VITALS — BP 124/74 | HR 49 | Ht 64.0 in | Wt 177.0 lb

## 2013-08-23 DIAGNOSIS — R002 Palpitations: Secondary | ICD-10-CM

## 2013-08-23 DIAGNOSIS — I499 Cardiac arrhythmia, unspecified: Secondary | ICD-10-CM

## 2013-08-23 DIAGNOSIS — I498 Other specified cardiac arrhythmias: Secondary | ICD-10-CM

## 2013-08-23 NOTE — Patient Instructions (Signed)
Your physician recommends that you schedule a follow-up appointment in: as needed  Your physician has recommended that you wear a holter monitor. Holter monitors are medical devices that record the heart's electrical activity. Doctors most often use these monitors to diagnose arrhythmias. Arrhythmias are problems with the speed or rhythm of the heartbeat. The monitor is a small, portable device. You can wear one while you do your normal daily activities. This is usually used to diagnose what is causing palpitations/syncope (passing out). 24 hour   Your physician has requested that you have en exercise stress myoview. For further information please visit [email or web address]. Please follow instruction sheet, as given.  Your physician has requested that you have an echocardiogram. Echocardiography is a painless test that uses sound waves to create images of your heart. It provides your doctor with information about the size and shape of your heart and how well your heart's chambers and valves are working. This procedure takes approximately one hour. There are no restrictions for this procedure.

## 2013-08-23 NOTE — Progress Notes (Signed)
HPI Patient is a 63 yo who presents in referral for evaluation of PVCs She is undergoing eval for bilat carpal tunnel surgery  EKG showed ventricular bigeminy She has a history of HTN   She denies palpitaitons. Sometimes when moving around a lot can get CP Went to ER once with CP  About 8 months ago.  Felt to be gas. Says about 1 month ago walked around track 5 times then home  Got CP  Eased with rest   No syncope  No dizziness Walks   Maternal GF with heart trouble.    Allergies  Allergen Reactions  . Ibuprofen     REACTION: GI upset    Current Outpatient Prescriptions  Medication Sig Dispense Refill  . amLODipine (NORVASC) 5 MG tablet TAKE ONE TABLET BY MOUTH ONCE DAILY  30 tablet  6  . buPROPion (WELLBUTRIN SR) 150 MG 12 hr tablet Take 150 mg by mouth 2 (two) times daily. Takes for SMOKING.      . cyclobenzaprine (FLEXERIL) 10 MG tablet Take 1 tablet (10 mg total) by mouth 3 (three) times daily as needed for muscle spasms.  30 tablet  2  . esomeprazole (NEXIUM) 40 MG capsule Take 40 mg by mouth daily at 12 noon.      . gabapentin (NEURONTIN) 300 MG capsule Take 300-600 mg by mouth 2 (two) times daily. 300 mg in the morning and 600 mg at bedtime.      . senna-docusate (SENOKOT-S) 8.6-50 MG per tablet Take 4-5 tablets by mouth daily.       No current facility-administered medications for this visit.    Past Medical History  Diagnosis Date  . Hypertension   . Reflux   . Diverticular disease   . Arthritis     generalized OA  . Carpal tunnel syndrome   . GERD (gastroesophageal reflux disease)   . Anxiety     Past Surgical History  Procedure Laterality Date  . Abdominal hysterectomy    . Tonsillectomy    . Cholecystectomy      Family History  Problem Relation Age of Onset  . Arthritis Mother   . Diabetes Father   . Hypertension Sister   . Depression Paternal Uncle     History   Social History  . Marital Status: Widowed    Spouse Name: N/A    Number of  Children: N/A  . Years of Education: N/A   Occupational History  . Not on file.   Social History Main Topics  . Smoking status: Former Smoker -- 0.25 packs/day for 20 years    Types: Cigarettes  . Smokeless tobacco: Former Systems developer    Quit date: 06/12/2013     Comment: trying to quit with electronic cigarette  . Alcohol Use: No     Comment: (1) beer per day-states stopped 06/2013  . Drug Use: No  . Sexual Activity: Yes    Birth Control/ Protection: Surgical   Other Topics Concern  . Not on file   Social History Narrative  . No narrative on file    Review of Systems:  All systems reviewed.  They are negative to the above problem except as previously stated.  Vital Signs: Ht 5\' 4"  (1.626 m)  Wt 177 lb (80.287 kg)  BMI 30.37 kg/m2  Physical Exam Patient is in NAD HEENT:  Normocephalic, atraumatic. EOMI, PERRLA.  Neck: JVP is normal.  No bruits.  Lungs: clear to auscultation. No rales no wheezes.  Heart: Regular rate and  rhythm. Normal S1, S2. No S3.   No significant murmurs. PMI not displaced.  Abdomen:  Supple, nontender. Normal bowel sounds. No masses. No hepatomegaly.  Extremities:   Good distal pulses throughout. No lower extremity edema.  Musculoskeletal :moving all extremities.  Neuro:   alert and oriented x3.  CN II-XII grossly intact.  EKG  08/12/13:  SR 90 with ventricular bigeminy (LBB pattern) Assessment and Plan:  1  CP  Would set up for stresss MIBI 2.  Ventricular bigeminy  Will set up for stress test, echo and 24 hour holter.  Clearance based on test results  F/U tentatively 1 year.

## 2013-08-29 ENCOUNTER — Inpatient Hospital Stay (HOSPITAL_COMMUNITY): Admission: RE | Admit: 2013-08-29 | Payer: PRIVATE HEALTH INSURANCE | Source: Ambulatory Visit

## 2013-08-29 ENCOUNTER — Ambulatory Visit (HOSPITAL_COMMUNITY)
Admission: RE | Admit: 2013-08-29 | Discharge: 2013-08-29 | Disposition: A | Discharge status: Home / Self Care | Payer: PRIVATE HEALTH INSURANCE | Source: Ambulatory Visit | Attending: Internal Medicine | Admitting: Internal Medicine

## 2013-08-29 ENCOUNTER — Encounter (HOSPITAL_COMMUNITY)
Admission: RE | Admit: 2013-08-29 | Discharge: 2013-08-29 | Disposition: A | Discharge status: Home / Self Care | Payer: PRIVATE HEALTH INSURANCE | Source: Ambulatory Visit | Attending: Internal Medicine | Admitting: Internal Medicine

## 2013-08-29 ENCOUNTER — Encounter (HOSPITAL_COMMUNITY): Payer: Self-pay

## 2013-08-29 DIAGNOSIS — R079 Chest pain, unspecified: Secondary | ICD-10-CM | POA: Insufficient documentation

## 2013-08-29 DIAGNOSIS — I498 Other specified cardiac arrhythmias: Secondary | ICD-10-CM | POA: Insufficient documentation

## 2013-08-29 DIAGNOSIS — R002 Palpitations: Secondary | ICD-10-CM | POA: Insufficient documentation

## 2013-08-29 DIAGNOSIS — R9439 Abnormal result of other cardiovascular function study: Secondary | ICD-10-CM | POA: Diagnosis not present

## 2013-08-29 DIAGNOSIS — I10 Essential (primary) hypertension: Secondary | ICD-10-CM | POA: Diagnosis not present

## 2013-08-29 DIAGNOSIS — I499 Cardiac arrhythmia, unspecified: Secondary | ICD-10-CM

## 2013-08-29 DIAGNOSIS — I4949 Other premature depolarization: Secondary | ICD-10-CM | POA: Diagnosis not present

## 2013-08-29 MED ORDER — SODIUM CHLORIDE 0.9 % IJ SOLN
INTRAMUSCULAR | Status: AC
  Administered 2013-08-29: 10 mL via INTRAVENOUS
  Filled 2013-08-29: qty 10

## 2013-08-29 MED ORDER — TECHNETIUM TC 99M SESTAMIBI - CARDIOLITE
10.0000 | Freq: Once | INTRAVENOUS | Status: AC | PRN
  Administered 2013-08-29: 10 via INTRAVENOUS

## 2013-08-29 MED ORDER — TECHNETIUM TC 99M SESTAMIBI GENERIC - CARDIOLITE
30.0000 | Freq: Once | INTRAVENOUS | Status: AC | PRN
  Administered 2013-08-29: 30 via INTRAVENOUS

## 2013-08-29 MED ORDER — REGADENOSON 0.4 MG/5ML IV SOLN
INTRAVENOUS | Status: AC
  Filled 2013-08-29: qty 5

## 2013-08-29 MED ORDER — SODIUM CHLORIDE 0.9 % IJ SOLN
10.0000 mL | INTRAMUSCULAR | Status: DC | PRN
  Administered 2013-08-29: 10 mL via INTRAVENOUS

## 2013-08-29 NOTE — Progress Notes (Signed)
24 hour Holter Monitor in progress. 

## 2013-08-29 NOTE — Progress Notes (Signed)
Stress Lab Nurses Notes - [place] 08/29/2013 Reason for doing test: Chest Pain and bigeminy Type of test: Stress Cardiolite Nurse performing test: Gerrit Halls, RN Nuclear Medicine Tech: Melburn Hake Echo Tech: Not Applicable MD performing test: Branch/K.Purcell Nails NP Family MD: [facility] explained and consent signed: Yes.   IV started: Saline lock flushed, No redness or edema and Saline lock started in radiology Symptoms: Fatigue Treatment/Intervention: None Reason test stopped: fatigue After recovery IV was: Discontinued via X-ray tech and No redness or edema Patient to return to Nuc. Med at : 13:00 Patient discharged: Home Patient's Condition upon discharge was: stable Comments: During test peak BP 148/73 & HR 150.  Recovery BP 152/116 & HR 86.  Symptoms resolved in recovery.  Geanie Cooley T

## 2013-08-30 ENCOUNTER — Ambulatory Visit (HOSPITAL_COMMUNITY)
Admission: RE | Admit: 2013-08-30 | Discharge: 2013-08-30 | Disposition: A | Discharge status: Home / Self Care | Payer: PRIVATE HEALTH INSURANCE | Source: Ambulatory Visit | Attending: Internal Medicine | Admitting: Internal Medicine

## 2013-08-30 DIAGNOSIS — R002 Palpitations: Secondary | ICD-10-CM | POA: Diagnosis present

## 2013-08-30 DIAGNOSIS — I498 Other specified cardiac arrhythmias: Secondary | ICD-10-CM

## 2013-08-30 DIAGNOSIS — I499 Cardiac arrhythmia, unspecified: Secondary | ICD-10-CM

## 2013-08-30 DIAGNOSIS — I059 Rheumatic mitral valve disease, unspecified: Secondary | ICD-10-CM

## 2013-08-30 NOTE — Progress Notes (Signed)
  Echocardiogram 2D Echocardiogram has been performed.  [place], [employer] 08/30/2013, 4:00 PM

## 2013-09-03 ENCOUNTER — Telehealth: Payer: Self-pay | Admitting: *Deleted

## 2013-09-03 ENCOUNTER — Emergency Department (HOSPITAL_COMMUNITY): Payer: PRIVATE HEALTH INSURANCE

## 2013-09-03 ENCOUNTER — Emergency Department (HOSPITAL_COMMUNITY)
Admission: EM | Admit: 2013-09-03 | Discharge: 2013-09-03 | Disposition: A | Discharge status: Home / Self Care | Payer: PRIVATE HEALTH INSURANCE | Attending: Emergency Medicine | Admitting: Emergency Medicine

## 2013-09-03 ENCOUNTER — Telehealth: Payer: Self-pay | Admitting: Family Medicine

## 2013-09-03 ENCOUNTER — Encounter (HOSPITAL_COMMUNITY): Payer: Self-pay | Admitting: Emergency Medicine

## 2013-09-03 DIAGNOSIS — Z8739 Personal history of other diseases of the musculoskeletal system and connective tissue: Secondary | ICD-10-CM | POA: Diagnosis not present

## 2013-09-03 DIAGNOSIS — R0789 Other chest pain: Secondary | ICD-10-CM

## 2013-09-03 DIAGNOSIS — Z87891 Personal history of nicotine dependence: Secondary | ICD-10-CM | POA: Insufficient documentation

## 2013-09-03 DIAGNOSIS — R079 Chest pain, unspecified: Secondary | ICD-10-CM | POA: Diagnosis present

## 2013-09-03 DIAGNOSIS — I10 Essential (primary) hypertension: Secondary | ICD-10-CM | POA: Insufficient documentation

## 2013-09-03 DIAGNOSIS — K219 Gastro-esophageal reflux disease without esophagitis: Secondary | ICD-10-CM | POA: Diagnosis not present

## 2013-09-03 DIAGNOSIS — F411 Generalized anxiety disorder: Secondary | ICD-10-CM | POA: Insufficient documentation

## 2013-09-03 DIAGNOSIS — Z79899 Other long term (current) drug therapy: Secondary | ICD-10-CM | POA: Insufficient documentation

## 2013-09-03 DIAGNOSIS — Z8669 Personal history of other diseases of the nervous system and sense organs: Secondary | ICD-10-CM | POA: Diagnosis not present

## 2013-09-03 DIAGNOSIS — R071 Chest pain on breathing: Secondary | ICD-10-CM | POA: Insufficient documentation

## 2013-09-03 LAB — COMPREHENSIVE METABOLIC PANEL
ALT: 16 U/L (ref 0–35)
AST: 16 U/L (ref 0–37)
Albumin: 3.7 g/dL (ref 3.5–5.2)
Alkaline Phosphatase: 96 U/L (ref 39–117)
Anion gap: 12 (ref 5–15)
BILIRUBIN TOTAL: 0.3 mg/dL (ref 0.3–1.2)
BUN: 10 mg/dL (ref 6–23)
CO2: 26 meq/L (ref 19–32)
CREATININE: 1.06 mg/dL (ref 0.50–1.10)
Calcium: 9.2 mg/dL (ref 8.4–10.5)
Chloride: 102 mEq/L (ref 96–112)
GFR calc Af Amer: 65 mL/min — ABNORMAL LOW (ref >90)
GFR, EST NON AFRICAN AMERICAN: 56 mL/min — AB (ref >90)
GLUCOSE: 106 mg/dL — AB (ref 70–99)
Potassium: 3.9 mEq/L (ref 3.7–5.3)
Sodium: 140 mEq/L (ref 137–147)
Total Protein: 6.9 g/dL (ref 6.0–8.3)

## 2013-09-03 LAB — CBC WITH DIFFERENTIAL/PLATELET
BASOS ABS: 0 10*3/uL (ref 0.0–0.1)
Basophils Relative: 1 % (ref 0–1)
Eosinophils Absolute: 0.2 10*3/uL (ref 0.0–0.7)
Eosinophils Relative: 3 % (ref 0–5)
HEMATOCRIT: 38.5 % (ref 36.0–46.0)
HEMOGLOBIN: 13 g/dL (ref 12.0–15.0)
LYMPHS ABS: 2.5 10*3/uL (ref 0.7–4.0)
Lymphocytes Relative: 45 % (ref 12–46)
MCH: 31.6 pg (ref 26.0–34.0)
MCHC: 33.8 g/dL (ref 30.0–36.0)
MCV: 93.4 fL (ref 78.0–100.0)
MONO ABS: 0.4 10*3/uL (ref 0.1–1.0)
Monocytes Relative: 7 % (ref 3–12)
NEUTROS ABS: 2.4 10*3/uL (ref 1.7–7.7)
Neutrophils Relative %: 44 % (ref 43–77)
Platelets: 347 10*3/uL (ref 150–400)
RBC: 4.12 MIL/uL (ref 3.87–5.11)
RDW: 13 % (ref 11.5–15.5)
WBC: 5.4 10*3/uL (ref 4.0–10.5)

## 2013-09-03 LAB — TROPONIN I

## 2013-09-03 MED ORDER — DIAZEPAM 10 MG PO TABS: 10.0000 mg | ORAL_TABLET | Freq: Four times a day (QID) | ORAL | Status: DC | PRN

## 2013-09-03 NOTE — Telephone Encounter (Signed)
Patient is calling to check on her heart monitor test results and saying that we are gonna have to do something she is in a lot of pain  [phone]

## 2013-09-03 NOTE — Telephone Encounter (Signed)
Pt is still having some chest pressure and wants to know what then next step is as far as testing or follow up

## 2013-09-03 NOTE — Telephone Encounter (Signed)
I will forward to Dr.Ross

## 2013-09-03 NOTE — ED Provider Notes (Signed)
CSN: [id number]     Arrival date & time 09/03/13  1801 History  This chart was scribed for Tammy Blade, MD by Delphia Grates, ED Scribe. This patient was seen in room APA08/APA08 and the patient's care was started at 7:33 PM.    Chief Complaint  Patient presents with  . Chest Pain     The history is provided by the patient. No language interpreter was used.    HPI Comments: ELIF YONTS is a 63 y.o. female who presents to the Emergency Department complaining of intermittent chest pest onset 3-4 days ago. Patient was evaluated 5 days ago for the same, and is still awaiting results. She was told there was no blockage after a stress test was given.. There is associated chest tightness and constipation. Patient reports the pain "can last all night at times". Patient is currently taking blood pressure medication (last dose at approximately 1400). Patient denies history of CHF. She denies cough, SOB, nausea, and emesis. Patient has history of HTN, diverticular disease, arthritis, GERD, and anxiety.   Past Medical History  Diagnosis Date  . Hypertension   . Reflux   . Diverticular disease   . Arthritis     generalized OA  . Carpal tunnel syndrome   . GERD (gastroesophageal reflux disease)   . Anxiety    Past Surgical History  Procedure Laterality Date  . Abdominal hysterectomy    . Tonsillectomy    . Cholecystectomy     Family History  Problem Relation Age of Onset  . Arthritis Mother   . Diabetes Father   . Hypertension Sister   . Depression Paternal Uncle    History  Substance Use Topics  . Smoking status: Former Smoker -- 0.25 packs/day for 20 years    Types: Cigarettes  . Smokeless tobacco: Former Systems developer    Quit date: 06/12/2013     Comment: trying to quit with electronic cigarette  . Alcohol Use: No     Comment: (1) beer per day-states stopped 06/2013   OB History   Grav Para Term Preterm Abortions TAB SAB Ect Mult Living                 Review of Systems   Constitutional: Negative for fever and chills.  Respiratory: Positive for chest tightness. Negative for cough and shortness of breath.   Cardiovascular: Positive for chest pain.  Gastrointestinal: Negative for nausea and vomiting.  All other systems reviewed and are negative.     Allergies  Ibuprofen  Home Medications   Prior to Admission medications   Medication Sig Start Date End Date Taking? Authorizing Provider  amLODipine (NORVASC) 5 MG tablet Take 5 mg by mouth daily.   Yes Historical Provider, MD  buPROPion (WELLBUTRIN SR) 150 MG 12 hr tablet Take 150 mg by mouth 2 (two) times daily. Takes for SMOKING.   Yes Historical Provider, MD  esomeprazole (NEXIUM) 40 MG capsule Take 40 mg by mouth daily at 12 noon.   Yes Historical Provider, MD  gabapentin (NEURONTIN) 300 MG capsule Take 300-600 mg by mouth 2 (two) times daily. 300 mg in the morning and 600 mg at bedtime.   Yes Historical Provider, MD  senna-docusate (SENOKOT-S) 8.6-50 MG per tablet Take 4-5 tablets by mouth daily.   Yes Historical Provider, MD  diazepam (VALIUM) 10 MG tablet Take 1 tablet (10 mg total) by mouth every 6 (six) hours as needed (Muscle spasm). 09/03/13   Tammy Blade, MD   Triage  Vitals: BP 150/75  Pulse 74  Temp(Src) 97.9 F (36.6 C) (Oral)  Resp 18  Ht 5\' 4"  (1.626 m)  Wt 172 lb (78.019 kg)  BMI 29.51 kg/m2  SpO2 98%  Physical Exam  Nursing note and vitals reviewed. Constitutional: She is oriented to person, place, and time. She appears well-developed and well-nourished.  HENT:  Head: Normocephalic and atraumatic.  Eyes: Conjunctivae and EOM are normal. Pupils are equal, round, and reactive to light.  Neck: Normal range of motion and phonation normal. Neck supple.  Cardiovascular: Normal rate, regular rhythm and intact distal pulses.   Irregular heartbeat without murmur.  Pulmonary/Chest: Effort normal and breath sounds normal. No respiratory distress. She has no wheezes. She exhibits no  tenderness.  Abdominal: Soft. She exhibits no distension. There is no tenderness. There is no guarding.  Musculoskeletal: Normal range of motion.  Neurological: She is alert and oriented to person, place, and time. She exhibits normal muscle tone.  Skin: Skin is warm and dry.  Psychiatric: She has a normal mood and affect. Her behavior is normal. Judgment and thought content normal.    ED Course  Procedures (including critical care time)  Medications - No data to display  Patient Vitals for the past 24 hrs:  BP Temp Temp src Pulse Resp SpO2 Height Weight  09/03/13 2102 142/95 mmHg - - 86 20 98 % - -  09/03/13 1841 150/75 mmHg - - 74 18 98 % - -  09/03/13 1830 144/77 mmHg 97.9 F (36.6 C) Oral 75 18 95 % 5\' 4"  (1.626 m) 172 lb (78.019 kg)    7:38 PM- Discussed with patient the results of her CXR and stress test (preformed by Dr. Harrington Challenger). Patient was informed her results were unremarkable and within normal limits. Patient advised to take Tylenol, Aleve, or Motrin for pain as needed, as well as apply heat to the area. Pt advised of plan for treatment and pt agrees.   8:18 PM Reevaluation with update and discussion. After initial assessment and treatment, an updated evaluation reveals normal test results. Will prescribe pain medication. Patient also advised to take Motrin as well. Patient agrees. Shenoa Hattabaugh L   Labs Review Labs Reviewed  COMPREHENSIVE METABOLIC PANEL - Abnormal; Notable for the following:    Glucose, Bld 106 (*)    GFR calc non Af Amer 56 (*)    GFR calc Af Amer 65 (*)    All other components within normal limits  CBC WITH DIFFERENTIAL  TROPONIN I    Imaging Review Dg Chest Portable 1 View  09/03/2013   CLINICAL DATA:  Left chest pain.  Dysrhythmia.  EXAM: PORTABLE CHEST - 1 VIEW  COMPARISON:  Report from 07/25/2011  FINDINGS: Mild cardiomegaly with cardiothoracic index 61%. Faintly indistinct pulmonary vasculature may reflect pulmonary venous hypertension. No  overt edema. No pleural effusion or airspace opacity observed.  IMPRESSION: 1. Mildly enlarged cardiopericardial silhouette with suspected pulmonary venous hypertension but no overt edema.   Electronically Signed   By: Sherryl Barters M.D.   On: 09/03/2013 18:54     EKG Interpretation None      MDM   Final diagnoses:  Chest wall pain    Evaluation is consistent with chest wall pain. Doubt ACS, PE, or pneumonia.   Nursing Notes Reviewed/ Care Coordinated Applicable Imaging Reviewed Interpretation of Laboratory Data incorporated into ED treatment  The patient appears reasonably screened and/or stabilized for discharge and I doubt any other medical condition or other [facility] requiring further  screening, evaluation, or treatment in the ED at this time prior to discharge.  Plan: Home Medications- Valium; Home Treatments- rest, heat; return here if the recommended treatment, does not improve the symptoms; Recommended follow up- PCP prn  I personally performed the services described in this documentation, which was scribed in my presence. The recorded information has been reviewed and is accurate.     Tammy Blade, MD 09/04/13 [phone]

## 2013-09-03 NOTE — Telephone Encounter (Signed)
Call returned to patient.   States that she has concerns about what to do about her heart and would like to speak with MD.   Advised that Dr. Harrington Challenger is handling her cardiology needs and she should F/U with [facility].   Attempted to schedule appointment with patient in regards to pain.   States that she will discuss with cardiologist.

## 2013-09-03 NOTE — Discharge Instructions (Signed)
Take ibuprofen 400 mg 3 times a day, with meals for pain. Try using heat on the sore area 3 or 4 times a day.  Chest Wall Pain Chest wall pain is pain in or around the bones and muscles of your chest. It may take up to 6 weeks to get better. It may take longer if you must stay physically active in your work and activities.  CAUSES  Chest wall pain may happen on its own. However, it may be caused by:  A viral illness like the flu.  Injury.  Coughing.  Exercise.  Arthritis.  Fibromyalgia.  Shingles. HOME CARE INSTRUCTIONS   Avoid overtiring physical activity. Try not to strain or perform activities that cause pain. This includes any activities using your chest or your abdominal and side muscles, especially if heavy weights are used.  Put ice on the sore area.  Put ice in a plastic bag.  Place a towel between your skin and the bag.  Leave the ice on for 15-20 minutes per hour while awake for the first 2 days.  Only take over-the-counter or prescription medicines for pain, discomfort, or fever as directed by your caregiver. SEEK IMMEDIATE MEDICAL CARE IF:   Your pain increases, or you are very uncomfortable.  You have a fever.  Your chest pain becomes worse.  You have new, unexplained symptoms.  You have nausea or vomiting.  You feel sweaty or lightheaded.  You have a cough with phlegm (sputum), or you cough up blood. MAKE SURE YOU:   Understand these instructions.  Will watch your condition.  Will get help right away if you are not doing well or get worse. Document Released: 01/03/2005 Document Revised: 03/28/2011 Document Reviewed: 08/30/2010 [facility] Patient Information 2015 [place], [place]. This information is not intended to replace advice given to you by your health care provider. Make sure you discuss any questions you have with your health care provider.

## 2013-09-03 NOTE — ED Notes (Signed)
PT c/o chest pain x6 days. PT was evaluated last Thursday for chest pain. PT denies any SOB, diaphoresis, N/V. PT describes pain as aching and tightness to the chest.

## 2013-09-04 ENCOUNTER — Telehealth: Payer: Self-pay | Admitting: *Deleted

## 2013-09-04 NOTE — Telephone Encounter (Signed)
Spoke to pt she is feeling the same as when she left ED and per her request she has appointment for next week with Jory Sims.

## 2013-09-04 NOTE — Telephone Encounter (Signed)
Received direct call from scheduling that pt was on phone complaining of on going chest pain after being seen in ED. Spoke with pt who reports she was seen in ED with chest pain and tightness last night. She reports chest pain went away but tightness never completely went away. She reports chest tightness that is worse with activity such as walking and sweeping.  Ongoing at present time. Pt reports she was told testing was OK. ED note reviewed and indicates testing was normal. States she was given muscle relaxer but has not started yet.  Pt is seen in [place] office and is asking about follow up appt.  I instructed pt to go to ED for evaluation of continuing chest tightness and that I would forward note to [place] office for follow up.

## 2013-09-04 NOTE — Telephone Encounter (Signed)
Patient had stress MIBI that showed no ischemia or scar Echo showed normal LVEF With prolonged chest tightness as she is still having would refer to GI Set f/u in [place] office

## 2013-09-10 ENCOUNTER — Encounter: Payer: Self-pay | Admitting: Adult Health

## 2013-09-10 ENCOUNTER — Ambulatory Visit (INDEPENDENT_AMBULATORY_CARE_PROVIDER_SITE_OTHER): Payer: PRIVATE HEALTH INSURANCE | Admitting: Adult Health

## 2013-09-10 VITALS — BP 122/72 | HR 77 | Ht 64.0 in | Wt 176.0 lb

## 2013-09-10 DIAGNOSIS — R002 Palpitations: Secondary | ICD-10-CM | POA: Insufficient documentation

## 2013-09-10 DIAGNOSIS — F17200 Nicotine dependence, unspecified, uncomplicated: Secondary | ICD-10-CM

## 2013-09-10 MED ORDER — METOPROLOL TARTRATE 25 MG PO TABS: 12.5000 mg | ORAL_TABLET | Freq: Two times a day (BID) | ORAL | Status: DC

## 2013-09-10 NOTE — Progress Notes (Signed)
HPI: Ms. Tammy Austin is a 63 year old patient of Dr. Harrington Challenger. We are following for ongoing assessment and management of frequent palpitations with PVCs, with recent EKG showing ventricular bigeminy, with history of hypertension. She was last seen by Dr. Harrington Challenger on 08/23/2013 and scheduled for a stress Myoview, and echocardiogram, along with a 24-hour Holter monitor.  Echo:  Left ventricle: The cavity size was normal. Wall thickness was normal. Systolic function was normal. The estimated ejection fraction was in the range of 60% to 65%. Wall motion was normal; there were no regional wall motion abnormalities. The study is not technically sufficient to allow evaluation of LV diastolic function. - Aortic valve: There was trivial regurgitation. - Mitral valve: There was mild regurgitation. - Left atrium: The atrium was moderately dilated. - Right atrium: The atrium was mildly dilated. - Atrial septum: No defect or patent foramen ovale was identified. - Pulmonary arteries: PA peak pressure: 31 mm Hg (S). - Frequent PVCs noted during exam . - Technically adequate study.  Stress Test Abnormal exercise Cardiolite as outlined. Patient achieved a maximum  work load of 10.4 METS without chest pain or ischemic ST segment  changes. There was suppression of PVCs with exercise, and return in  recovery without sustained arrhythmia. Low risk Duke treadmill score  of 8. Perfusion imaging shows no clear evidence of scar or ischemia  with soft tissue attenuation at the anterior apex. LVEF was  calculated at 35% with diffuse hypokinesis and normal volumes. Would  question whether there were any problems with gating due to frequent  ventricular ectopy. An echocardiogram is suggested for better  evaluation of LVEF.  Holter Report:  Sinus rhythm, Frequent PVCs produced 10% total beats ) with intermittent bigeminy and trigeminy. No VT. Largely unifocal PVCs. Some different morphology. Rare PACs.   She comes today  feeling some better, but still has some palpitations. She admits to being a heavy drinker until 4 months ago and also drinks a lot of [employer] soft drinks.    Allergies  Allergen Reactions  . Ibuprofen     REACTION: GI upset    Current Outpatient Prescriptions  Medication Sig Dispense Refill  . amLODipine (NORVASC) 5 MG tablet Take 5 mg by mouth daily.      Marland Kitchen buPROPion (WELLBUTRIN SR) 150 MG 12 hr tablet Take 150 mg by mouth 2 (two) times daily. Takes for SMOKING.      . diazepam (VALIUM) 10 MG tablet Take 1 tablet (10 mg total) by mouth every 6 (six) hours as needed (Muscle spasm).  20 tablet  0  . esomeprazole (NEXIUM) 40 MG capsule Take 40 mg by mouth daily at 12 noon.      . gabapentin (NEURONTIN) 300 MG capsule Take 300-600 mg by mouth 2 (two) times daily. 300 mg in the morning and 600 mg at bedtime.      . senna-docusate (SENOKOT-S) 8.6-50 MG per tablet Take 4-5 tablets by mouth daily.       No current facility-administered medications for this visit.    Past Medical History  Diagnosis Date  . Hypertension   . Reflux   . Diverticular disease   . Arthritis     generalized OA  . Carpal tunnel syndrome   . GERD (gastroesophageal reflux disease)   . Anxiety     Past Surgical History  Procedure Laterality Date  . Abdominal hysterectomy    . Tonsillectomy    . Cholecystectomy      ROS: Review of systems  complete and found to be negative unless listed above  PHYSICAL EXAM BP 122/72  Pulse 77  Ht 5\' 4"  (1.626 m)  Wt 176 lb (79.833 kg)  BMI 30.20 kg/m2  SpO2 99% General: Well developed, well nourished, in no acute distress Head: Eyes PERRLA, No xanthomas.   Normal cephalic and atramatic  Lungs: Clear bilaterally to auscultation and percussion. Heart: HRRR S1 S2, without MRG.  Pulses are 2+ & equal.            No carotid bruit. No JVD.  No abdominal bruits. No femoral bruits. Abdomen: Bowel sounds are positive, abdomen soft and non-tender without masses or                   Hernia's noted. Msk:  Back normal, normal gait. Normal strength and tone for age. Extremities: No clubbing, cyanosis or edema.  DP +1 Neuro: Alert and oriented X 3. Psych:  Good affect, responds appropriately   ASSESSMENT AND PLAN

## 2013-09-10 NOTE — Progress Notes (Deleted)
Name: Tammy Austin    DOB: 14-May-1950  Age: 63 y.o.  MR#: [id number]       PCP:  Vic Blackbird, MD      Insurance: Payor: Onnie Boer MEDICARE / Plan: Cottie Banda / Product Type: *No Product type* /   CC:    Chief Complaint  Patient presents with  . Hypertension  . Palpitations    VS Filed Vitals:   09/10/13 1415  BP: 122/72  Pulse: 77  Height: 5' 4"  (1.626 m)  Weight: 176 lb (79.833 kg)  SpO2: 99%    Weights Current Weight  09/10/13 176 lb (79.833 kg)  09/03/13 172 lb (78.019 kg)  08/23/13 177 lb (80.287 kg)    Blood Pressure  BP Readings from Last 3 Encounters:  09/10/13 122/72  09/03/13 142/95  08/23/13 124/74     Admit date:  (Not on file) Last encounter with RMR:  Visit date not found   Allergy Ibuprofen  Current Outpatient Prescriptions  Medication Sig Dispense Refill  . amLODipine (NORVASC) 5 MG tablet Take 5 mg by mouth daily.      Marland Kitchen buPROPion (WELLBUTRIN SR) 150 MG 12 hr tablet Take 150 mg by mouth 2 (two) times daily. Takes for SMOKING.      . diazepam (VALIUM) 10 MG tablet Take 1 tablet (10 mg total) by mouth every 6 (six) hours as needed (Muscle spasm).  20 tablet  0  . esomeprazole (NEXIUM) 40 MG capsule Take 40 mg by mouth daily at 12 noon.      . gabapentin (NEURONTIN) 300 MG capsule Take 300-600 mg by mouth 2 (two) times daily. 300 mg in the morning and 600 mg at bedtime.      . senna-docusate (SENOKOT-S) 8.6-50 MG per tablet Take 4-5 tablets by mouth daily.       No current facility-administered medications for this visit.    Discontinued Meds:   There are no discontinued medications.  Patient Active Problem List   Diagnosis Date Noted  . Bilateral carpal tunnel syndrome 08/06/2013  . External nasal lesion 03/15/2013  . Numerous moles 03/15/2013  . Diverticular disease 08/07/2012  . Generalized OA 02/24/2012  . TOBACCO USER 01/31/2008  . INSOMNIA 01/31/2008  . HYPERLIPIDEMIA 12/20/2007  . CARPAL TUNNEL SYNDROME 12/20/2007  .  HYPERTENSION 12/20/2007  . BRONCHITIS, CHRONIC 12/20/2007  . GERD 12/20/2007  . IBS 12/20/2007  . LOW BACK PAIN, CHRONIC 12/20/2007    LABS    Component Value Date/Time   NA 140 09/03/2013 1839   NA 138 07/12/2013 1555   NA 141 08/27/2012 1510   K 3.9 09/03/2013 1839   K 4.5 07/12/2013 1555   K 4.2 08/27/2012 1510   CL 102 09/03/2013 1839   CL 105 07/12/2013 1555   CL 107 08/27/2012 1510   CO2 26 09/03/2013 1839   CO2 25 07/12/2013 1555   CO2 27 08/27/2012 1510   GLUCOSE 106* 09/03/2013 1839   GLUCOSE 91 07/12/2013 1555   GLUCOSE 103* 08/27/2012 1510   BUN 10 09/03/2013 1839   BUN 7 07/12/2013 1555   BUN 9 08/27/2012 1510   CREATININE 1.06 09/03/2013 1839   CREATININE 0.83 07/12/2013 1555   CREATININE 0.91 08/27/2012 1510   CREATININE 0.77 07/03/2012 1704   CREATININE 0.93 05/06/2010 2324   CREATININE 0.80 05/30/2008 0207   CALCIUM 9.2 09/03/2013 1839   CALCIUM 9.5 07/12/2013 1555   CALCIUM 9.1 08/27/2012 1510   GFRNONAA 56* 09/03/2013 1839   GFRNONAA >60 05/06/2010 2324  GFRAA 65* 09/03/2013 1839   GFRAA  Value: >60        The eGFR has been calculated using the MDRD equation. This calculation has not been validated in all clinical situations. eGFR's persistently <60 mL/min signify possible Chronic Kidney Disease. 05/06/2010 2324   CMP     Component Value Date/Time   NA 140 09/03/2013 1839   K 3.9 09/03/2013 1839   CL 102 09/03/2013 1839   CO2 26 09/03/2013 1839   GLUCOSE 106* 09/03/2013 1839   BUN 10 09/03/2013 1839   CREATININE 1.06 09/03/2013 1839   CREATININE 0.83 07/12/2013 1555   CALCIUM 9.2 09/03/2013 1839   PROT 6.9 09/03/2013 1839   ALBUMIN 3.7 09/03/2013 1839   AST 16 09/03/2013 1839   ALT 16 09/03/2013 1839   ALKPHOS 96 09/03/2013 1839   BILITOT 0.3 09/03/2013 1839   GFRNONAA 56* 09/03/2013 1839   GFRAA 65* 09/03/2013 1839       Component Value Date/Time   WBC 5.4 09/03/2013 1839   WBC 5.1 07/12/2013 1555   WBC 4.9 11/02/2012 1555   HGB 13.0 09/03/2013 1839   HGB 12.7 07/12/2013 1555    HGB 13.2 11/02/2012 1555   HCT 38.5 09/03/2013 1839   HCT 37.7 07/12/2013 1555   HCT 39.1 11/02/2012 1555   MCV 93.4 09/03/2013 1839   MCV 95.2 07/12/2013 1555   MCV 95.4 11/02/2012 1555    Lipid Panel     Component Value Date/Time   CHOL 209* 07/12/2013 1555   TRIG 94 07/12/2013 1555   HDL 63 07/12/2013 1555   CHOLHDL 3.3 07/12/2013 1555   VLDL 19 07/12/2013 1555   LDLCALC 127* 07/12/2013 1555    ABG No results found for this basename: phart, pco2, pco2art, po2, po2art, hco3, tco2, acidbasedef, o2sat     Lab Results  Component Value Date   TSH 1.312 08/05/2011   BNP (last 3 results) No results found for this basename: PROBNP,  in the last 8760 hours Cardiac Panel (last 3 results) No results found for this basename: CKTOTAL, CKMB, TROPONINI, RELINDX,  in the last 72 hours  Iron/TIBC/Ferritin/ %Sat No results found for this basename: iron, tibc, ferritin, ironpctsat     EKG Orders placed during the hospital encounter of 09/03/13  . EKG 12-LEAD  . EKG 12-LEAD  . EKG     Prior Assessment and Plan Problem List as of 09/10/2013     Cardiovascular and Mediastinum   HYPERTENSION   Last Assessment & Plan   07/12/2013 Office Visit Written 07/12/2013  4:35 PM by Alycia Rossetti, MD     Blood pressure looks good continue current      Respiratory   BRONCHITIS, CHRONIC     Digestive   GERD   Last Assessment & Plan   07/03/2012 Office Visit Written 07/03/2012  9:13 PM by Alycia Rossetti, MD     Doing well on PPI    IBS   Last Assessment & Plan   07/28/2011 Office Visit Written 07/31/2011  8:12 PM by Alycia Rossetti, MD     Reviewed Colonoscopy , diverticular diease also noted    Diverticular disease   Last Assessment & Plan   08/06/2012 Office Visit Written 08/07/2012  7:49 AM by Alycia Rossetti, MD     She was given a handout on diverticular disease. At this point she can go back to her high fiber diet. Advised her she begins having pain she should go to a clear liquid  diet  and call for further instructions      Nervous and Auditory   CARPAL TUNNEL SYNDROME   Last Assessment & Plan   07/12/2013 Office Visit Written 07/12/2013  4:36 PM by Alycia Rossetti, MD     Gabapentin increased to 600 mg at bedtime continue 300 on the day of also change her to Percocet to help with her hands as well as her back. She will followup status post orthopedics which I think that she will need surgical intervention    Bilateral carpal tunnel syndrome     Musculoskeletal and Integument   Generalized OA   Last Assessment & Plan   11/02/2012 Office Visit Written 11/04/2012  9:39 AM by Alycia Rossetti, MD     Continue meds      Other   HYPERLIPIDEMIA   Last Assessment & Plan   07/12/2013 Office Visit Written 07/12/2013  4:35 PM by Alycia Rossetti, MD     Check fasting labs    TOBACCO USER   Last Assessment & Plan   07/12/2013 Office Visit Written 07/12/2013  4:36 PM by Alycia Rossetti, MD     She continues to improve with her tobacco use she was to continue the Wellbutrin    LOW BACK PAIN, CHRONIC   Last Assessment & Plan   07/12/2013 Office Visit Written 07/12/2013  4:35 PM by Alycia Rossetti, MD     Gabapentin increased to 600 mg at bedtime she will continue 300 during the day. Work on weight loss as well    INSOMNIA   External nasal lesion   Last Assessment & Plan   03/13/2013 Office Visit Edited 03/15/2013  3:54 PM by Alycia Rossetti, MD     It appears to be a benign lesion on her nose however it is growing in size. I think dermatology needs to review this and have this removed    Numerous moles   Last Assessment & Plan   03/13/2013 Office Visit Written 03/15/2013  3:53 PM by Alycia Rossetti, MD     She has many moles on her legs and upper extremities however she does have one large one on her back of her right leg that needs to be removed        Imaging: Nm Myocar Multi W/spect W/wall Motion / Ef  08/29/2013   CLINICAL DATA:  63 year old woman with history of  hypertension, documented PVCs, and chest pain. This study is requested to evaluate for the presence and extent of ischemia.  EXAM: MYOCARDIAL IMAGING WITH SPECT (REST AND EXERCISE)  GATED LEFT VENTRICULAR WALL MOTION STUDY  LEFT VENTRICULAR EJECTION FRACTION  TECHNIQUE: Standard myocardial SPECT imaging was performed after resting intravenous injection of 10 mCi [id number]. Subsequently, exercise tolerance test was performed by the patient under the supervision of the Cardiology staff. At peak-stress, 30 mCi [id number] was injected intravenously and standard myocardial SPECT imaging was performed. Quantitative gated imaging was also performed to evaluate left ventricular wall motion, and estimate left ventricular ejection fraction.  FINDINGS: Baseline tracing shows normal sinus rhythm with PVCs and pattern of trigeminy. Patient was exercised on a Bruce protocol for 7 min and 44 seconds achieving a maximum work load of 10.4 METS. Peak heart rate was 155 beats per min which was 96% of the maximal age predicted heart rate response. Peak blood pressure was 158/110. No chest pain was reported. No diagnostic ST segment changes were noted to suggest ischemia. There was suppression of  PVCs with exercise, return in recovery noted in pattern of bigeminy, no sustained arrhythmias noted however.  Analysis of the overall perfusion data finds adequate myocardial radiotracer uptake  Tomographic views were obtained using the short axis, vertical long axis, and horizontal long axis planes. There is a small, mild intensity, apical anterior defect that is consistent with soft tissue attenuation, and is more prominent on rest imaging.  Gated imaging reveals an EDV of 101, ESV of 66, calculated LVEF of 35%, and TID ratio of 1.09. No obvious focal wall motion abnormality identified.  IMPRESSION: Abnormal exercise Cardiolite as outlined. Patient achieved a maximum work load of 10.4 METS without chest pain or ischemic ST  segment changes. There was suppression of PVCs with exercise, and return in recovery without sustained arrhythmia. Low risk Duke treadmill score of 8. Perfusion imaging shows no clear evidence of scar or ischemia with soft tissue attenuation at the anterior apex. LVEF was calculated at 35% with diffuse hypokinesis and normal volumes. Would question whether there were any problems with gating due to frequent ventricular ectopy. An echocardiogram is suggested for better evaluation of LVEF.   Electronically Signed   By: Rozann Lesches M.D.   On: 08/29/2013 14:26   Dg Chest Portable 1 View  09/03/2013   CLINICAL DATA:  Left chest pain.  Dysrhythmia.  EXAM: PORTABLE CHEST - 1 VIEW  COMPARISON:  Report from 07/25/2011  FINDINGS: Mild cardiomegaly with cardiothoracic index 61%. Faintly indistinct pulmonary vasculature may reflect pulmonary venous hypertension. No overt edema. No pleural effusion or airspace opacity observed.  IMPRESSION: 1. Mildly enlarged cardiopericardial silhouette with suspected pulmonary venous hypertension but no overt edema.   Electronically Signed   By: Sherryl Barters M.D.   On: 09/03/2013 18:54

## 2013-09-10 NOTE — Assessment & Plan Note (Signed)
I will begin low dose metoprolol 12.5 mg BID, add magnesium 200 mg daily as she is a former heavy drinker and also takes PPI.  She will return to the office in 2 weeks for repeat EKG.  If normalized, and hemodynamically stable, will clear to proceed with carpel tunnel surgery.  She is also advised to decrease caffeine intake. She verbalizes understanding.

## 2013-09-10 NOTE — Assessment & Plan Note (Signed)
Currently well controlled. Will monitor BP response to addition of BB.

## 2013-09-10 NOTE — Assessment & Plan Note (Signed)
States she quit smoking 2 months ago, but will have a rare cigarette once a week. I have advised her to quit all together.

## 2013-09-10 NOTE — Patient Instructions (Signed)
Your physician has recommended you make the following change in your medication:   START METOPROLOL 12.5 MG TWICE DAILY  START TAKING MAGNESIUM 200 MG DAILY   Your physician recommends that you schedule a follow-up appointment in: 1 WEEK FOR AN EKG AND VITAL SIGNS  Thank you for choosing [facility]!!

## 2013-09-19 ENCOUNTER — Ambulatory Visit (INDEPENDENT_AMBULATORY_CARE_PROVIDER_SITE_OTHER): Payer: PRIVATE HEALTH INSURANCE

## 2013-09-19 VITALS — BP 126/64 | HR 77 | Ht 64.0 in | Wt 177.0 lb

## 2013-09-19 DIAGNOSIS — I10 Essential (primary) hypertension: Secondary | ICD-10-CM

## 2013-09-19 MED ORDER — METOPROLOL TARTRATE 25 MG PO TABS: 25.0000 mg | ORAL_TABLET | Freq: Two times a day (BID) | ORAL | Status: DC

## 2013-09-19 NOTE — Progress Notes (Signed)
Nurse visit for EKG and BP check,started on metoprolol 12.5 mg bid last week  Pt reports feeling better,denies palpitations,states she is sleeping more   EKG done,reviewed by K.Purcell Nails NP

## 2013-09-19 NOTE — Patient Instructions (Signed)
Your physician has recommended you make the following change in your medication:      INCREASE metoprolol to 25 mg twice a day  Continue to check your blood pressure everyday   We will send a letter to your doctor so you can schedule your carpal tunnel surgey      Thank you for choosing [facility] !

## 2013-09-20 ENCOUNTER — Other Ambulatory Visit: Payer: Self-pay | Admitting: Family Medicine

## 2013-09-20 NOTE — Telephone Encounter (Signed)
Refill appropriate and filled per protocol. 

## 2013-10-01 ENCOUNTER — Telehealth: Payer: Self-pay | Admitting: Orthopedic Surgery

## 2013-10-01 NOTE — Telephone Encounter (Signed)
Patient called to inquire about clearance for her carpal tunnel surgery, which had to be cancelled for 08/16/13.  Please review and advise, and also whether a new appointment is needed.  Her ph# is [phone].

## 2013-10-09 NOTE — Telephone Encounter (Signed)
Called patient, unavailable, left message to return call

## 2013-10-14 NOTE — Telephone Encounter (Signed)
CALLED PATIENT, NOT AVAILABLE, LEFT MESSAGE

## 2013-10-15 ENCOUNTER — Other Ambulatory Visit: Payer: Self-pay | Admitting: *Deleted

## 2013-10-16 ENCOUNTER — Encounter (HOSPITAL_COMMUNITY): Payer: Self-pay | Admitting: Pharmacy Technician

## 2013-10-16 NOTE — Telephone Encounter (Signed)
Spoke with patient yesterday and she is ready to proceed with left carpal tunnel surgery

## 2013-10-21 NOTE — Patient Instructions (Signed)
Tammy Austin  10/21/2013   Your procedure is scheduled on:  10/25/2013  Report to [facility] at 7:00 AM.  Call this number if you have problems the morning of surgery: [phone]   Remember:   Do not eat food or drink liquids after midnight.   Take these medicines the morning of surgery with A SIP OF WATER:   Amlodipine, Wellbutrin, Valium, Gabapentin, Metoprolol, Prilosec   Do not wear jewelry, make-up or nail polish.  Do not wear lotions, powders, or perfumes. You may wear deodorant.  Do not shave 48 hours prior to surgery. Men may shave face and neck.  Do not bring valuables to the hospital.  [facility] is not responsible for any belongings or valuables.               Contacts, dentures or bridgework may not be worn into surgery.  Leave suitcase in the car. After surgery it may be brought to your room.  For patients admitted to the hospital, discharge time is determined by your treatment team.               Patients discharged the day of surgery will not be allowed to drive home.  Name and phone number of your driver:   Special Instructions: Shower using CHG 2 nights before surgery and the night before surgery.  If you shower the day of surgery use CHG.  Use special wash - you have one bottle of CHG for all showers.  You should use approximately 1/3 of the bottle for each shower.   Please read over the following fact sheets that you were given: Surgical Site Infection Prevention and Anesthesia Post-op Instructions   PATIENT INSTRUCTIONS POST-ANESTHESIA  IMMEDIATELY FOLLOWING SURGERY:  Do not drive or operate machinery for the first twenty four hours after surgery.  Do not make any important decisions for twenty four hours after surgery or while taking narcotic pain medications or sedatives.  If you develop intractable nausea and vomiting or a severe headache please notify your doctor immediately.  FOLLOW-UP:  Please make an appointment with your surgeon as instructed. You do  not need to follow up with anesthesia unless specifically instructed to do so.  WOUND CARE INSTRUCTIONS (if applicable):  Keep a dry clean dressing on the anesthesia/puncture wound site if there is drainage.  Once the wound has quit draining you may leave it open to air.  Generally you should leave the bandage intact for twenty four hours unless there is drainage.  If the epidural site drains for more than 36-48 hours please call the anesthesia department.  QUESTIONS?:  Please feel free to call your physician or the hospital operator if you have any questions, and they will be happy to assist you.      Carpal Tunnel Release Carpal tunnel release is done to relieve the pressure on the nerves and tendons on the bottom side of your wrist.  LET YOUR CAREGIVER KNOW ABOUT:   Allergies to food or medicine.  Medicines taken, including vitamins, herbs, eyedrops, over-the-counter medicines, and creams.  Use of steroids (by mouth or creams).  Previous problems with anesthetics or numbing medicines.  History of bleeding problems or blood clots.  Previous surgery.  Other health problems, including diabetes and kidney problems.  Possibility of pregnancy, if this applies. RISKS AND COMPLICATIONS  Some problems that may happen after this procedure include:  Infection.  Damage to the nerves, arteries or tendons could occur. This would be very uncommon.  Bleeding. BEFORE THE PROCEDURE   This surgery may be done while you are asleep (general anesthetic) or may be done under a block where only your forearm and the surgical area is numb.  If the surgery is done under a block, the numbness will gradually wear off within several hours after surgery. HOME CARE INSTRUCTIONS   Have a responsible person with you for 24 hours.  Do not drive a car or use public transportation for 24 hours.  Only take over-the-counter or prescription medicines for pain, discomfort, or fever as directed by your  caregiver. Take them as directed.  You may put ice on the palm side of the affected wrist.  Put ice in a plastic bag.  Place a towel between your skin and the bag.  Leave the ice on for 20 to 30 minutes, 4 times per day.  If you were given a splint to keep your wrist from bending, use it as directed. It is important to wear the splint at night or as directed. Use the splint for as long as you have pain or numbness in your hand, arm, or wrist. This may take 1 to 2 months.  Keep your hand raised (elevated) above the level of your heart as much as possible. This keeps swelling down and helps with discomfort.  Change bandages (dressings) as directed.  Keep the wound clean and dry. SEEK MEDICAL CARE IF:   You develop pain not relieved with medications.  You develop numbness of your hand.  You develop bleeding from your surgical site.  You have an oral temperature above 102 F (38.9 C).  You develop redness or swelling of the surgical site.  You develop new, unexplained problems. SEEK IMMEDIATE MEDICAL CARE IF:   You develop a rash.  You have difficulty breathing.  You develop any reaction or side effects to medications given. Document Released: 03/26/2003 Document Revised: 03/28/2011 Document Reviewed: 11/09/2006 [facility] Patient Information 2015 [place], [place]. This information is not intended to replace advice given to you by your health care provider. Make sure you discuss any questions you have with your health care provider.

## 2013-10-22 ENCOUNTER — Encounter (HOSPITAL_COMMUNITY): Payer: Self-pay

## 2013-10-22 ENCOUNTER — Encounter (HOSPITAL_COMMUNITY)
Admission: RE | Admit: 2013-10-22 | Discharge: 2013-10-22 | Disposition: A | Discharge status: Home / Self Care | Payer: PRIVATE HEALTH INSURANCE | Source: Ambulatory Visit | Attending: Orthopedic Surgery | Admitting: Orthopedic Surgery

## 2013-10-22 DIAGNOSIS — Z79899 Other long term (current) drug therapy: Secondary | ICD-10-CM | POA: Diagnosis not present

## 2013-10-22 DIAGNOSIS — G5602 Carpal tunnel syndrome, left upper limb: Secondary | ICD-10-CM | POA: Diagnosis not present

## 2013-10-22 DIAGNOSIS — I10 Essential (primary) hypertension: Secondary | ICD-10-CM | POA: Diagnosis not present

## 2013-10-22 DIAGNOSIS — F419 Anxiety disorder, unspecified: Secondary | ICD-10-CM | POA: Diagnosis not present

## 2013-10-22 DIAGNOSIS — K219 Gastro-esophageal reflux disease without esophagitis: Secondary | ICD-10-CM | POA: Diagnosis not present

## 2013-10-22 DIAGNOSIS — F17210 Nicotine dependence, cigarettes, uncomplicated: Secondary | ICD-10-CM | POA: Diagnosis not present

## 2013-10-22 LAB — BASIC METABOLIC PANEL
ANION GAP: 12 (ref 5–15)
BUN: 9 mg/dL (ref 6–23)
CO2: 26 mEq/L (ref 19–32)
Calcium: 9.3 mg/dL (ref 8.4–10.5)
Chloride: 105 mEq/L (ref 96–112)
Creatinine, Ser: 0.79 mg/dL (ref 0.50–1.10)
GFR calc Af Amer: >90 mL/min (ref >90)
GFR calc non Af Amer: 88 mL/min — ABNORMAL LOW (ref >90)
Glucose, Bld: 97 mg/dL (ref 70–99)
Potassium: 4.5 mEq/L (ref 3.7–5.3)
SODIUM: 143 meq/L (ref 137–147)

## 2013-10-22 LAB — HEMOGLOBIN AND HEMATOCRIT, BLOOD
HEMATOCRIT: 39 % (ref 36.0–46.0)
HEMOGLOBIN: 13.2 g/dL (ref 12.0–15.0)

## 2013-10-22 LAB — SURGICAL PCR SCREEN
MRSA, PCR: NEGATIVE
Staphylococcus aureus: NEGATIVE

## 2013-10-23 ENCOUNTER — Telehealth: Payer: Self-pay | Admitting: Orthopedic Surgery

## 2013-10-23 NOTE — Telephone Encounter (Signed)
Regarding out-patient surgery scheduled at [facility] 10/25/13, CPT [phone], contacted insurers: primary, [employer], ph# [phone], 10/22/13; per Ned Card,  no pre-authorization required for out-patient procedures; secondary payor, Medicaid, no pre-authorization required, per online system [place] [employer].

## 2013-10-24 NOTE — H&P (Signed)
Tammy Austin is an 63 y.o. female.   Chief Complaint   Patient presents with   .  Carpal Tunnel       Carpal Tunnel Syndrome. Referred by Dr. Buelah Manis     63 years old 4 years symptoms both hands pain swelling catching stiffness numbness tingling Throbbing aching Constant 9/10 Nerve conduction studies positive bilateral carpal tunnel syndrome worse since the previous test. Previous treatment injection therapy and will leave gabapentin bracing Night pain weakness   Past Medical History  Diagnosis Date  . Hypertension   . Reflux   . Diverticular disease   . Arthritis     generalized OA  . Carpal tunnel syndrome   . GERD (gastroesophageal reflux disease)   . Anxiety     Past Surgical History  Procedure Laterality Date  . Abdominal hysterectomy    . Tonsillectomy    . Cholecystectomy      Family History  Problem Relation Age of Onset  . Arthritis Mother   . Diabetes Father   . Hypertension Sister   . Depression Paternal Uncle    Social History:  reports that she has been smoking Cigarettes.  She started smoking about 43 years ago. She has a 5 pack-year smoking history. She quit smokeless tobacco use about 4 months ago. She reports that she does not drink alcohol or use illicit drugs.  Allergies:  Allergies  Allergen Reactions  . Ibuprofen     REACTION: GI upset    No prescriptions prior to admission    No results found for this or any previous visit (from the past 48 hour(s)). No results found.  ROS Review of systems has not changed since the office visit on July 21 There were no vitals taken for this visit. Physical Exam  VS BP 137/72  Ht 5\' 4"  (1.626 m)  Wt 180 lb (81.647 kg)  BMI 30.88 kg/m2  General and hygiene are normal. Development and nutrition are normal. Body habitus medium   Mood Affect are normal The patient is alert and oriented x3 Ambulatory status normal   RUE (include skin) Inspection reveals no abnormalities. Joint range of motion is  within normal limits without contracture. There are no subluxations. Muscle tone is normal. Skin is warm dry and intact without rash lesion or ulceration.  Left and right upper extremity Tenderness over the volar aspect of the flexor retinaculum right worse than left. Normal range of motion. Normal wrist stability. Poor grip strength. Skin normal. Positive carpal tunnel compression test. Positive Phalen test. Decreased sensation fingertips median nerve distribution including half of the ring finger.   LLE Inspection reveals no abnormalities. Joint range of motion is within normal limits without contracture. There are no subluxations. Muscle tone is normal. Skin is warm dry and intact without rash lesion or ulceration.  CDV peripheral pulses are intact without swelling or varicose veins  LYMPH are normal in all 4 extremities with no palpable nodes  DTR are equal and symmetric Balance  is normal  Bilateral carpal tunnel syndrome. Risks benefits explained. Left carpal tunnel release. 3-4 weeks later right carpal tunnel release.  Clearly explained to the patient only 85% chance of improvement because of longevity in severity of testing  Patient agrees to accept those risks and start with left carpal tunnel release   Arther Abbott 10/24/2013, 4:34 PM

## 2013-10-25 ENCOUNTER — Encounter (HOSPITAL_COMMUNITY)
Admission: RE | Disposition: A | Discharge status: Home / Self Care | Payer: Self-pay | Source: Ambulatory Visit | Attending: Orthopedic Surgery

## 2013-10-25 ENCOUNTER — Encounter (HOSPITAL_COMMUNITY): Payer: PRIVATE HEALTH INSURANCE | Admitting: Anesthesiology

## 2013-10-25 ENCOUNTER — Encounter (HOSPITAL_COMMUNITY): Payer: Self-pay | Admitting: *Deleted

## 2013-10-25 ENCOUNTER — Ambulatory Visit (HOSPITAL_COMMUNITY)
Admission: RE | Admit: 2013-10-25 | Discharge: 2013-10-25 | Disposition: A | Discharge status: Home / Self Care | Payer: PRIVATE HEALTH INSURANCE | Source: Ambulatory Visit | Attending: Orthopedic Surgery | Admitting: Orthopedic Surgery

## 2013-10-25 ENCOUNTER — Ambulatory Visit (HOSPITAL_COMMUNITY): Payer: PRIVATE HEALTH INSURANCE | Admitting: Anesthesiology

## 2013-10-25 DIAGNOSIS — F17210 Nicotine dependence, cigarettes, uncomplicated: Secondary | ICD-10-CM | POA: Diagnosis not present

## 2013-10-25 DIAGNOSIS — Z79899 Other long term (current) drug therapy: Secondary | ICD-10-CM | POA: Insufficient documentation

## 2013-10-25 DIAGNOSIS — I10 Essential (primary) hypertension: Secondary | ICD-10-CM | POA: Diagnosis not present

## 2013-10-25 DIAGNOSIS — K219 Gastro-esophageal reflux disease without esophagitis: Secondary | ICD-10-CM | POA: Insufficient documentation

## 2013-10-25 DIAGNOSIS — F419 Anxiety disorder, unspecified: Secondary | ICD-10-CM | POA: Diagnosis not present

## 2013-10-25 DIAGNOSIS — G5602 Carpal tunnel syndrome, left upper limb: Secondary | ICD-10-CM | POA: Insufficient documentation

## 2013-10-25 HISTORY — PX: CARPAL TUNNEL RELEASE: SHX101

## 2013-10-25 SURGERY — CARPAL TUNNEL RELEASE
Anesthesia: Monitor Anesthesia Care | Site: Hand | Laterality: Left

## 2013-10-25 MED ORDER — FENTANYL CITRATE 0.05 MG/ML IJ SOLN
25.0000 ug | INTRAMUSCULAR | Status: DC | PRN
  Administered 2013-10-25: 50 ug via INTRAVENOUS
  Filled 2013-10-25: qty 2

## 2013-10-25 MED ORDER — LIDOCAINE HCL (PF) 0.5 % IJ SOLN
INTRAMUSCULAR | Status: AC
  Filled 2013-10-25: qty 50

## 2013-10-25 MED ORDER — ONDANSETRON HCL 4 MG/2ML IJ SOLN
4.0000 mg | Freq: Once | INTRAMUSCULAR | Status: AC | PRN
  Administered 2013-10-25: 4 mg via INTRAVENOUS
  Filled 2013-10-25: qty 2

## 2013-10-25 MED ORDER — FENTANYL CITRATE 0.05 MG/ML IJ SOLN
25.0000 ug | INTRAMUSCULAR | Status: AC
  Administered 2013-10-25 (×2): 25 ug via INTRAVENOUS

## 2013-10-25 MED ORDER — MIDAZOLAM HCL 5 MG/5ML IJ SOLN
INTRAMUSCULAR | Status: DC | PRN
  Administered 2013-10-25: 2 mg via INTRAVENOUS

## 2013-10-25 MED ORDER — CEFAZOLIN SODIUM-DEXTROSE 2-3 GM-% IV SOLR
INTRAVENOUS | Status: AC
  Filled 2013-10-25: qty 50

## 2013-10-25 MED ORDER — BUPIVACAINE HCL (PF) 0.5 % IJ SOLN
INTRAMUSCULAR | Status: DC | PRN
  Administered 2013-10-25: 10 mL

## 2013-10-25 MED ORDER — FENTANYL CITRATE 0.05 MG/ML IJ SOLN
INTRAMUSCULAR | Status: AC
  Filled 2013-10-25: qty 2

## 2013-10-25 MED ORDER — FENTANYL CITRATE 0.05 MG/ML IJ SOLN
INTRAMUSCULAR | Status: DC | PRN
  Administered 2013-10-25: 50 ug via INTRAVENOUS
  Administered 2013-10-25 (×2): 25 ug via INTRAVENOUS

## 2013-10-25 MED ORDER — MIDAZOLAM HCL 2 MG/2ML IJ SOLN
INTRAMUSCULAR | Status: AC
  Filled 2013-10-25: qty 2

## 2013-10-25 MED ORDER — ONDANSETRON HCL 4 MG/2ML IJ SOLN
4.0000 mg | Freq: Once | INTRAMUSCULAR | Status: AC
  Administered 2013-10-25: 4 mg via INTRAVENOUS

## 2013-10-25 MED ORDER — LACTATED RINGERS IV SOLN
INTRAVENOUS | Status: DC
  Administered 2013-10-25: 09:00:00 via INTRAVENOUS

## 2013-10-25 MED ORDER — MIDAZOLAM HCL 2 MG/2ML IJ SOLN
1.0000 mg | INTRAMUSCULAR | Status: DC | PRN
  Administered 2013-10-25: 2 mg via INTRAVENOUS

## 2013-10-25 MED ORDER — CHLORHEXIDINE GLUCONATE 4 % EX LIQD: 60.0000 mL | Freq: Once | CUTANEOUS | Status: DC

## 2013-10-25 MED ORDER — HYDROCODONE-ACETAMINOPHEN 7.5-325 MG PO TABS: 1.0000 | ORAL_TABLET | ORAL | Status: DC | PRN

## 2013-10-25 MED ORDER — PROPOFOL INFUSION 10 MG/ML OPTIME
INTRAVENOUS | Status: DC | PRN
  Administered 2013-10-25: 50 ug/kg/min via INTRAVENOUS

## 2013-10-25 MED ORDER — 0.9 % SODIUM CHLORIDE (POUR BTL) OPTIME
TOPICAL | Status: DC | PRN
  Administered 2013-10-25: 1000 mL

## 2013-10-25 MED ORDER — BUPIVACAINE HCL (PF) 0.5 % IJ SOLN
INTRAMUSCULAR | Status: AC
  Filled 2013-10-25: qty 30

## 2013-10-25 MED ORDER — CEFAZOLIN SODIUM-DEXTROSE 2-3 GM-% IV SOLR
2.0000 g | INTRAVENOUS | Status: AC
  Administered 2013-10-25: 2 g via INTRAVENOUS
  Filled 2013-10-25: qty 50

## 2013-10-25 MED ORDER — SODIUM CHLORIDE 0.9 % IJ SOLN
INTRAMUSCULAR | Status: AC
  Filled 2013-10-25: qty 10

## 2013-10-25 MED ORDER — ONDANSETRON HCL 4 MG/2ML IJ SOLN
INTRAMUSCULAR | Status: AC
  Filled 2013-10-25: qty 2

## 2013-10-25 MED ORDER — LIDOCAINE HCL (PF) 0.5 % IJ SOLN
INTRAMUSCULAR | Status: DC | PRN
  Administered 2013-10-25: 250 mg via INTRAVENOUS

## 2013-10-25 MED ORDER — PROPOFOL 10 MG/ML IV BOLUS
INTRAVENOUS | Status: AC
  Filled 2013-10-25: qty 20

## 2013-10-25 SURGICAL SUPPLY — 43 items
BAG HAMPER (MISCELLANEOUS) ×3 IMPLANT
BANDAGE ELASTIC 3 VELCRO NS (GAUZE/BANDAGES/DRESSINGS) ×3 IMPLANT
BANDAGE ESMARK 4X12 BL STRL LF (DISPOSABLE) ×1 IMPLANT
BLADE 15 SAFETY STRL DISP (BLADE) ×3 IMPLANT
BLADE SURG 15 STRL LF DISP TIS (BLADE) ×1 IMPLANT
BLADE SURG 15 STRL SS (BLADE) ×2
BNDG COHESIVE 4X5 TAN STRL (GAUZE/BANDAGES/DRESSINGS) ×3 IMPLANT
BNDG ESMARK 4X12 BLUE STRL LF (DISPOSABLE) ×3
BNDG GAUZE ELAST 4 BULKY (GAUZE/BANDAGES/DRESSINGS) ×3 IMPLANT
CHLORAPREP W/TINT 26ML (MISCELLANEOUS) ×3 IMPLANT
CLOTH BEACON ORANGE TIMEOUT ST (SAFETY) ×3 IMPLANT
COVER LIGHT HANDLE STERIS (MISCELLANEOUS) ×6 IMPLANT
CUFF TOURNIQUET SINGLE 18IN (TOURNIQUET CUFF) ×3 IMPLANT
DECANTER SPIKE VIAL GLASS SM (MISCELLANEOUS) ×3 IMPLANT
DRSG XEROFORM 1X8 (GAUZE/BANDAGES/DRESSINGS) ×3 IMPLANT
ELECT NEEDLE TIP 2.8 STRL (NEEDLE) ×3 IMPLANT
ELECT REM PT RETURN 9FT ADLT (ELECTROSURGICAL) ×3
ELECTRODE REM PT RTRN 9FT ADLT (ELECTROSURGICAL) ×1 IMPLANT
GAUZE KERLIX 2  STERILE LF (GAUZE/BANDAGES/DRESSINGS) ×3 IMPLANT
GAUZE SPONGE 4X4 12PLY STRL (GAUZE/BANDAGES/DRESSINGS) ×3 IMPLANT
GLOVE BIOGEL PI IND STRL 7.0 (GLOVE) ×1 IMPLANT
GLOVE BIOGEL PI INDICATOR 7.0 (GLOVE) ×2
GLOVE ECLIPSE 6.5 STRL STRAW (GLOVE) ×3 IMPLANT
GLOVE EXAM NITRILE MD LF STRL (GLOVE) ×3 IMPLANT
GLOVE SKINSENSE NS SZ8.0 LF (GLOVE) ×2
GLOVE SKINSENSE STRL SZ8.0 LF (GLOVE) ×1 IMPLANT
GLOVE SS N UNI LF 8.5 STRL (GLOVE) ×3 IMPLANT
GOWN STRL REUS W/ TWL LRG LVL3 (GOWN DISPOSABLE) ×2 IMPLANT
GOWN STRL REUS W/TWL LRG LVL3 (GOWN DISPOSABLE) ×4
GOWN STRL REUS W/TWL XL LVL3 (GOWN DISPOSABLE) ×3 IMPLANT
HAND ALUMI XLG (SOFTGOODS) ×3 IMPLANT
KIT ROOM TURNOVER APOR (KITS) ×3 IMPLANT
MANIFOLD NEPTUNE II (INSTRUMENTS) ×3 IMPLANT
NEEDLE HYPO 21X1.5 SAFETY (NEEDLE) ×3 IMPLANT
NS IRRIG 1000ML POUR BTL (IV SOLUTION) ×3 IMPLANT
PACK BASIC LIMB (CUSTOM PROCEDURE TRAY) ×3 IMPLANT
PAD ARMBOARD 7.5X6 YLW CONV (MISCELLANEOUS) ×3 IMPLANT
PADDING WEBRIL 3 STERILE (GAUZE/BANDAGES/DRESSINGS) ×3 IMPLANT
PADDING WEBRIL 4 STERILE (GAUZE/BANDAGES/DRESSINGS) ×3 IMPLANT
SET BASIN LINEN APH (SET/KITS/TRAYS/PACK) ×3 IMPLANT
SPONGE GAUZE 4X4 12PLY STER LF (GAUZE/BANDAGES/DRESSINGS) ×3 IMPLANT
SUT ETHILON 3 0 FSL (SUTURE) ×3 IMPLANT
SYR CONTROL 10ML LL (SYRINGE) ×3 IMPLANT

## 2013-10-25 NOTE — Interval H&P Note (Signed)
History and Physical Interval Note:  10/25/2013 8:34 AM  Tammy Austin  has presented today for surgery, with the diagnosis of carpal tunnel syndrome  The various methods of treatment have been discussed with the patient and family. After consideration of risks, benefits and other options for treatment, the patient has consented to  Procedure(s): CARPAL TUNNEL RELEASE (Left) as a surgical intervention .  The patient's history has been reviewed, patient examined, no change in status, stable for surgery.  I have reviewed the patient's chart and labs.  Questions were answered to the patient's satisfaction.     Arther Abbott

## 2013-10-25 NOTE — Op Note (Signed)
10/25/2013  10:15 AM  PATIENT:  Tammy Austin  63 y.o. female  PRE-OPERATIVE DIAGNOSIS: LEFT carpal tunnel syndrome  POST-OPERATIVE DIAGNOSIS:  LEFT carpal tunnel syndrome  PROCEDURE:  Procedure(s): CARPAL TUNNEL RELEASE (Left)  STENOSIS CARPAL TUNNEL   SURGEON:  Surgeon(s) and Role:    * Carole Civil, MD - Primary  PHYSICIAN ASSISTANT:   ASSISTANTS: none   ANESTHESIA:   regional  EBL:  Total I/O In: 100 [I.V.:100] Out: -   BLOOD ADMINISTERED:none  DRAINS: none   LOCAL MEDICATIONS USED:  MARCAINE    and Amount: 10 ml  SPECIMEN:  No Specimen  DISPOSITION OF SPECIMEN:  N/A  COUNTS:  YES  TOURNIQUET:   Total Tourniquet Time Documented: Upper Arm (Left) - 30 minutes Total: Upper Arm (Left) - 30 minutes   DICTATION: .Viviann Spare Dictation  PLAN OF CARE: Discharge to home after PACU  PATIENT DISPOSITION:  PACU - hemodynamically stable.   Delay start of Pharmacological VTE agent (>24hrs) due to surgical blood loss or risk of bleeding: not applicable  Procedure the patient was identified using 2 approved identification markers. The surgical site was marked as left wrist and hand for carpal tunnel release. The chart was reviewed and updated as well. She was then taken to surgery for Bier block/regional anesthetic  She was prepped and draped sterilely. Timeout was completed.  The incision was made over the carpal tunnel the left hand was deepened down to the palmar fascia which was then dissected to identify the distal aspect of the transverse carpal ligament. Ligament was released. The carpal tunnel contents were found to be normal with the exception of narrowing of the median nerve.  The wound is irrigated and closed with 3-0 nylon suture in running fashion  Sterile bandage was applied  The tourniquet was released. Color of the fingers was normal.

## 2013-10-25 NOTE — Transfer of Care (Signed)
Immediate Anesthesia Transfer of Care Note  Patient: Tammy Austin  Procedure(s) Performed: Procedure(s): CARPAL TUNNEL RELEASE (Left)  Patient Location: PACU  Anesthesia Type:Bier block  Level of Consciousness: awake, alert  and patient cooperative  Airway & Oxygen Therapy: Patient Spontanous Breathing and Patient connected to face mask oxygen  Post-op Assessment: Report given to PACU RN, Post -op Vital signs reviewed and stable and Patient moving all extremities  Post vital signs: Reviewed and stable  Complications: No apparent anesthesia complications

## 2013-10-25 NOTE — Brief Op Note (Addendum)
10/25/2013  10:15 AM  PATIENT:  Tammy Austin  63 y.o. female  PRE-OPERATIVE DIAGNOSIS: LEFT carpal tunnel syndrome  POST-OPERATIVE DIAGNOSIS:  LEFT carpal tunnel syndrome  PROCEDURE:  Procedure(s): CARPAL TUNNEL RELEASE (Left)  STENOSIS CARPAL TUNNEL   SURGEON:  Surgeon(s) and Role:    * Carole Civil, MD - Primary  PHYSICIAN ASSISTANT:   ASSISTANTS: none   ANESTHESIA:   regional  EBL:  Total I/O In: 100 [I.V.:100] Out: -   BLOOD ADMINISTERED:none  DRAINS: none   LOCAL MEDICATIONS USED:  MARCAINE    and Amount: 10 ml  SPECIMEN:  No Specimen  DISPOSITION OF SPECIMEN:  N/A  COUNTS:  YES  TOURNIQUET:   Total Tourniquet Time Documented: Upper Arm (Left) - 30 minutes Total: Upper Arm (Left) - 30 minutes   DICTATION: .Viviann Spare Dictation  PLAN OF CARE: Discharge to home after PACU  PATIENT DISPOSITION:  PACU - hemodynamically stable.   Delay start of Pharmacological VTE agent (>24hrs) due to surgical blood loss or risk of bleeding: not applicable  Procedure the patient was identified using 2 approved identification markers. The surgical site was marked as left wrist and hand for carpal tunnel release. The chart was reviewed and updated as well. She was then taken to surgery for Bier block/regional anesthetic  She was prepped and draped sterilely. Timeout was completed.  The incision was made over the carpal tunnel the left hand was deepened down to the palmar fascia which was then dissected to identify the distal aspect of the transverse carpal ligament. Ligament was released. The carpal tunnel contents were found to be normal with the exception of narrowing of the median nerve.  The wound is irrigated and closed with 3-0 nylon suture in running fashion  Sterile bandage was applied  The tourniquet was released. Color of the fingers was normal.

## 2013-10-25 NOTE — Anesthesia Procedure Notes (Signed)
Anesthesia Regional Block:  Bier block (IV Regional)  Pre-Anesthetic Checklist: ,, timeout performed, Correct Patient, Correct Site, Correct Laterality, Correct Procedure, Correct Position, site marked, surgical consent,  Laterality: Left  Prep: Betadine       Needles:  Injection technique: Single-shot      Additional Needles:  Motor weakness within 4 minutes. Bier block (IV Regional) Narrative:  Resident/CRNA: R Kerrin Markman,crna

## 2013-10-25 NOTE — Anesthesia Preprocedure Evaluation (Signed)
Anesthesia Evaluation  Patient identified by MRN, date of birth, ID band Patient awake    Reviewed: Allergy & Precautions, H&P , NPO status , Patient's Chart, lab work & pertinent test results, reviewed documented beta blocker date and time   Airway Mallampati: III TM Distance: >3 FB     Dental  (+) Edentulous Upper, Edentulous Lower   Pulmonary Current Smoker,  breath sounds clear to auscultation        Cardiovascular hypertension, Pt. on home beta blockers and Pt. on medications + dysrhythmias (hx frequent PVC's) Rhythm:Regular Rate:Normal     Neuro/Psych PSYCHIATRIC DISORDERS Anxiety  Neuromuscular disease    GI/Hepatic GERD-  Medicated and Controlled,  Endo/Other    Renal/GU      Musculoskeletal   Abdominal   Peds  Hematology   Anesthesia Other Findings   Reproductive/Obstetrics                           Anesthesia Physical Anesthesia Plan  ASA: III  Anesthesia Plan: Bier Block and MAC   Post-op Pain Management:    Induction: Intravenous  Airway Management Planned: Nasal Cannula  Additional Equipment:   Intra-op Plan:   Post-operative Plan:   Informed Consent: I have reviewed the patients History and Physical, chart, labs and discussed the procedure including the risks, benefits and alternatives for the proposed anesthesia with the patient or authorized representative who has indicated his/her understanding and acceptance.     Plan Discussed with:   Anesthesia Plan Comments:         Anesthesia Quick Evaluation

## 2013-10-25 NOTE — Discharge Instructions (Signed)
Elevate hand x72 hours apply ice packs to the wrist as needed to control swelling.  Move fingers as tolerated. Cannot lift anything over 1 pounds.  Carpal Tunnel Release Carpal tunnel release is done to relieve the pressure on the nerves and tendons on the bottom side of your wrist.  LET YOUR CAREGIVER KNOW ABOUT:   Allergies to food or medicine.  Medicines taken, including vitamins, herbs, eyedrops, over-the-counter medicines, and creams.  Use of steroids (by mouth or creams).  Previous problems with anesthetics or numbing medicines.  History of bleeding problems or blood clots.  Previous surgery.  Other health problems, including diabetes and kidney problems.  Possibility of pregnancy, if this applies. RISKS AND COMPLICATIONS  Some problems that may happen after this procedure include:  Infection.  Damage to the nerves, arteries or tendons could occur. This would be very uncommon.  Bleeding. BEFORE THE PROCEDURE   This surgery may be done while you are asleep (general anesthetic) or may be done under a block where only your forearm and the surgical area is numb.  If the surgery is done under a block, the numbness will gradually wear off within several hours after surgery. HOME CARE INSTRUCTIONS   Have a responsible person with you for 24 hours.  Do not drive a car or use public transportation for 24 hours.  Only take over-the-counter or prescription medicines for pain, discomfort, or fever as directed by your caregiver. Take them as directed.  You may put ice on the palm side of the affected wrist.  Put ice in a plastic bag.  Place a towel between your skin and the bag.  Leave the ice on for 20 to 30 minutes, 4 times per day.  If you were given a splint to keep your wrist from bending, use it as directed. It is important to wear the splint at night or as directed. Use the splint for as long as you have pain or numbness in your hand, arm, or wrist. This may take 1  to 2 months.  Keep your hand raised (elevated) above the level of your heart as much as possible. This keeps swelling down and helps with discomfort.  Change bandages (dressings) as directed.  Keep the wound clean and dry. SEEK MEDICAL CARE IF:   You develop pain not relieved with medications.  You develop numbness of your hand.  You develop bleeding from your surgical site.  You have an oral temperature above 102 F (38.9 C).  You develop redness or swelling of the surgical site.  You develop new, unexplained problems. SEEK IMMEDIATE MEDICAL CARE IF:   You develop a rash.  You have difficulty breathing.  You develop any reaction or side effects to medications given. Document Released: 03/26/2003 Document Revised: 03/28/2011 Document Reviewed: 11/09/2006 [facility] Patient Information 2015 [place], [place]. This information is not intended to replace advice given to you by your health care provider. Make sure you discuss any questions you have with your health care provider.

## 2013-10-25 NOTE — Anesthesia Postprocedure Evaluation (Signed)
  Anesthesia Post-op Note  Patient: Tammy Austin  Procedure(s) Performed: Procedure(s): CARPAL TUNNEL RELEASE (Left)  Patient Location: PACU  Anesthesia Type:Bier block  Level of Consciousness: awake, alert , oriented and patient cooperative  Airway and Oxygen Therapy: Patient Spontanous Breathing  Post-op Pain: 3 /10, mild  Post-op Assessment: Post-op Vital signs reviewed, Patient's Cardiovascular Status Stable, Respiratory Function Stable, Patent Airway and Pain level controlled  Post-op Vital Signs: Reviewed and stable  Last Vitals:  Filed Vitals:   10/25/13 0925  BP: 138/97  Pulse:   Temp:   Resp: 16    Complications: No apparent anesthesia complications

## 2013-10-28 ENCOUNTER — Ambulatory Visit (INDEPENDENT_AMBULATORY_CARE_PROVIDER_SITE_OTHER): Payer: Self-pay | Admitting: Orthopedic Surgery

## 2013-10-28 ENCOUNTER — Encounter: Payer: Self-pay | Admitting: Orthopedic Surgery

## 2013-10-28 VITALS — BP 150/88 | Ht 64.0 in | Wt 182.0 lb

## 2013-10-28 DIAGNOSIS — G5602 Carpal tunnel syndrome, left upper limb: Secondary | ICD-10-CM | POA: Insufficient documentation

## 2013-10-28 DIAGNOSIS — Z9889 Other specified postprocedural states: Secondary | ICD-10-CM

## 2013-10-28 NOTE — Addendum Note (Signed)
Addendum created 10/28/13 0726 by Ollen Bowl, CRNA   Modules edited: Anesthesia Flowsheet

## 2013-10-28 NOTE — Progress Notes (Signed)
Subjective:    Tammy Austin is here for followup after left carpal tunnel surgery. Pain is controlled with current analgesics. Medications being used: narcotic analgesics including hydrocodone/acetaminophen (Lorcet, Lortab, Norco, Vicodin).. The patient notes improvement in the following symptoms:numbness, still has problems with numbness, but less than preop. The patient denies fever, wound drainage, increasing redness, pus, increasing pain, increasing swelling. Post op problems reported: none, itching from the preoperative body wash.    Objective:     General :    alert and cooperative  Sutures:   Sutures in place.  Incision:  healing well, no significant drainage, no dehiscence, no significant erythema  Tenderness:  mild  Flexion ROM:  diminished range of motion  Extension ROM:  full range of motion  Effusion:  no     Assessment:    Status post left carpal tunnel surgery. Doing well postoperatively.    Plan:     Tegaderm dressing and a 2 inch Ace wrap applied Follow up: On the 22nd for suture removal days.

## 2013-11-07 ENCOUNTER — Encounter: Payer: Self-pay | Admitting: Orthopedic Surgery

## 2013-11-07 ENCOUNTER — Ambulatory Visit (INDEPENDENT_AMBULATORY_CARE_PROVIDER_SITE_OTHER): Payer: Self-pay | Admitting: Orthopedic Surgery

## 2013-11-07 VITALS — BP 139/83 | Ht 64.0 in | Wt 182.0 lb

## 2013-11-07 DIAGNOSIS — Z9889 Other specified postprocedural states: Secondary | ICD-10-CM

## 2013-11-07 MED ORDER — HYDROCODONE-ACETAMINOPHEN 5-325 MG PO TABS: 1.0000 | ORAL_TABLET | Freq: Four times a day (QID) | ORAL | Status: DC | PRN

## 2013-11-07 NOTE — Patient Instructions (Addendum)
Do hand exercises 3 x a day   Ok to shower   Remove tapes after 1 week

## 2013-11-07 NOTE — Progress Notes (Signed)
Patient ID: Tammy Austin, female   DOB: 1950/11/06, 63 y.o.   MRN: [id number] Chief Complaint  Patient presents with  . Follow-up    post op 2, suture removal, left carpal tunnel repair, DOS 10/25/13    The patient's pain is less but her fingers are still numb I suspect that this is caused by her long history of carpal tunnel syndrome for over 3 years. Hopefully she will get relief with time as the nerve recovers. Sutures are removed suture line looks clean skin seal was placed over the wound along with a Tegaderm. She can wash her hands but no submersion in water for another week  6 pack hand exercises  Followup 4 weeks  Reduce medication as follows   Meds ordered this encounter  Medications  . HYDROcodone-acetaminophen (NORCO/VICODIN) 5-325 MG per tablet    Sig: Take 1 tablet by mouth every 6 (six) hours as needed for moderate pain.    Dispense:  60 tablet    Refill:  0

## 2013-11-12 ENCOUNTER — Encounter: Payer: Self-pay | Admitting: Family Medicine

## 2013-11-12 ENCOUNTER — Ambulatory Visit (INDEPENDENT_AMBULATORY_CARE_PROVIDER_SITE_OTHER): Payer: PRIVATE HEALTH INSURANCE | Admitting: Family Medicine

## 2013-11-12 VITALS — BP 130/76 | HR 64 | Temp 98.3°F | Resp 14 | Ht 65.0 in | Wt 183.0 lb

## 2013-11-12 DIAGNOSIS — I10 Essential (primary) hypertension: Secondary | ICD-10-CM

## 2013-11-12 DIAGNOSIS — L989 Disorder of the skin and subcutaneous tissue, unspecified: Secondary | ICD-10-CM

## 2013-11-12 DIAGNOSIS — Z23 Encounter for immunization: Secondary | ICD-10-CM

## 2013-11-12 NOTE — Patient Instructions (Signed)
Continue current medications Flu shot and pneumonia shot given Referral to dermatology to mole removal  F/U 6 months

## 2013-11-12 NOTE — Assessment & Plan Note (Signed)
Blood pressure well controlled

## 2013-11-12 NOTE — Assessment & Plan Note (Signed)
Lesion is growing in size appears to be a mole however based on size and location I will defer to dermatology to have this removed

## 2013-11-12 NOTE — Progress Notes (Signed)
Patient ID: Tammy Austin, female   DOB: 13-Oct-1950, 63 y.o.   MRN: [id number]   Subjective:    Patient ID: Tammy Austin, female    DOB: [birth year], 63 y.o.   MRN: [id number]  Patient presents for 4 month F/U and Nasal Growth  Data follow-up chronic medical problems. She is concerned about a mole that is on her nose it is been enlarging over the past couple months and she's also noticed some bleeding from and she would like to have this removed. He isn't taking her medications as prescribed without any difficulties. She is status post carpal tunnel release of her left hand and is doing well with this.   Review Of Systems:  GEN- denies fatigue, fever, weight loss,weakness, recent illness HEENT- denies eye drainage, change in vision, nasal discharge, CVS- denies chest pain, palpitations RESP- denies SOB, cough, wheeze ABD- denies N/V, change in stools, abd pain GU- denies dysuria, hematuria, dribbling, incontinence MSK- + joint pain, muscle aches, injury Neuro- denies headache, dizziness, syncope, seizure activity       Objective:    BP 130/76  Pulse 64  Temp(Src) 98.3 F (36.8 C) (Oral)  Resp 14  Ht 5\' 5"  (1.651 m)  Wt 183 lb (83.008 kg)  BMI 30.45 kg/m2 GEN- NAD, alert and oriented x3 HEENT- PERRL, EOMI, non injected sclera, pink conjunctiva, MMM, oropharynx clear CVS- RRR, no murmur RESP-CTAB EXT- No edema, left hand mild swelling over incision no erythema no drainage Skin- large flesh toned mole on end of right nares- small scab at center Pulses- Radial, DP- 2+        Assessment & Plan:      Problem List Items Addressed This Visit   None    Visit Diagnoses   Need for prophylactic vaccination against Streptococcus pneumoniae (pneumococcus)    -  Primary    Relevant Orders       Flu Vaccine QUAD 36+ mos PF IM (Fluarix Quad PF) (Completed)       Pneumococcal polysaccharide vaccine 23-valent greater than or equal to 2yo subcutaneous/IM (Completed)    Need for  prophylactic vaccination and inoculation against influenza        Relevant Orders       Flu Vaccine QUAD 36+ mos PF IM (Fluarix Quad PF) (Completed)       Pneumococcal polysaccharide vaccine 23-valent greater than or equal to 2yo subcutaneous/IM (Completed)       Note: This dictation was prepared with Dragon dictation along with smaller phrase technology. Any transcriptional errors that result from this process are unintentional.

## 2013-12-04 ENCOUNTER — Encounter: Payer: Self-pay | Admitting: Neurology

## 2013-12-05 ENCOUNTER — Ambulatory Visit (INDEPENDENT_AMBULATORY_CARE_PROVIDER_SITE_OTHER): Payer: Self-pay | Admitting: Orthopedic Surgery

## 2013-12-05 ENCOUNTER — Encounter: Payer: Self-pay | Admitting: Orthopedic Surgery

## 2013-12-05 VITALS — BP 135/80 | Ht 64.0 in | Wt 183.0 lb

## 2013-12-05 DIAGNOSIS — Z9889 Other specified postprocedural states: Secondary | ICD-10-CM

## 2013-12-05 MED ORDER — NABUMETONE 500 MG PO TABS: 500.0000 mg | ORAL_TABLET | Freq: Two times a day (BID) | ORAL | Status: DC

## 2013-12-05 NOTE — Progress Notes (Signed)
Patient ID: Tammy Austin, female   DOB: 1950-11-11, 63 y.o.   MRN: [id number] Chief Complaint  Patient presents with  . Follow-up    4 week recheck on left CTR, DOS 10-25-13.   Encounter Diagnosis  Name Primary?  . S/P carpal tunnel release Yes   BP 135/80 mmHg  Ht 5\' 4"  (1.626 m)  Wt 183 lb (83.008 kg)  BMI 31.40 kg/m2  The patient is now 6 weeks status post carpal tunnel release for left carpal tunnel syndrome that she have for 10 years. She still having numbness but no night pain that wakes her up still has soreness over the incision with swelling but she can make a full fist she has some weakness  Recommend continued strengthening exercises. I told her that after 10 years of symptoms it is unusual to get immediate relief so we need to just wait this out and see how much recovery she'll get.  Meds ordered this encounter  Medications  . DISCONTD: nabumetone (RELAFEN) 500 MG tablet    Sig: Take 1 tablet (500 mg total) by mouth 2 (two) times daily.    Dispense:  60 tablet    Refill:  5  . nabumetone (RELAFEN) 500 MG tablet    Sig: Take 1 tablet (500 mg total) by mouth 2 (two) times daily.    Dispense:  60 tablet    Refill:  5

## 2013-12-05 NOTE — Patient Instructions (Signed)
Start relafen 500 mg twice a day

## 2013-12-10 ENCOUNTER — Encounter: Payer: Self-pay | Admitting: Neurology

## 2013-12-24 ENCOUNTER — Emergency Department (HOSPITAL_COMMUNITY)
Admission: EM | Admit: 2013-12-24 | Discharge: 2013-12-24 | Disposition: A | Discharge status: Home / Self Care | Payer: PRIVATE HEALTH INSURANCE | Attending: Emergency Medicine | Admitting: Emergency Medicine

## 2013-12-24 ENCOUNTER — Encounter (HOSPITAL_COMMUNITY): Payer: Self-pay

## 2013-12-24 DIAGNOSIS — M1990 Unspecified osteoarthritis, unspecified site: Secondary | ICD-10-CM | POA: Insufficient documentation

## 2013-12-24 DIAGNOSIS — Z720 Tobacco use: Secondary | ICD-10-CM | POA: Diagnosis not present

## 2013-12-24 DIAGNOSIS — F419 Anxiety disorder, unspecified: Secondary | ICD-10-CM | POA: Diagnosis not present

## 2013-12-24 DIAGNOSIS — K625 Hemorrhage of anus and rectum: Secondary | ICD-10-CM | POA: Diagnosis present

## 2013-12-24 DIAGNOSIS — Z79899 Other long term (current) drug therapy: Secondary | ICD-10-CM | POA: Insufficient documentation

## 2013-12-24 DIAGNOSIS — I10 Essential (primary) hypertension: Secondary | ICD-10-CM | POA: Diagnosis not present

## 2013-12-24 DIAGNOSIS — K219 Gastro-esophageal reflux disease without esophagitis: Secondary | ICD-10-CM | POA: Diagnosis not present

## 2013-12-24 DIAGNOSIS — K922 Gastrointestinal hemorrhage, unspecified: Secondary | ICD-10-CM | POA: Insufficient documentation

## 2013-12-24 LAB — CBC WITH DIFFERENTIAL/PLATELET
BASOS PCT: 1 % (ref 0–1)
Basophils Absolute: 0.1 10*3/uL (ref 0.0–0.1)
EOS ABS: 0.2 10*3/uL (ref 0.0–0.7)
EOS PCT: 3 % (ref 0–5)
HEMATOCRIT: 38.3 % (ref 36.0–46.0)
Hemoglobin: 13 g/dL (ref 12.0–15.0)
Lymphocytes Relative: 59 % — ABNORMAL HIGH (ref 12–46)
Lymphs Abs: 2.7 10*3/uL (ref 0.7–4.0)
MCH: 31.9 pg (ref 26.0–34.0)
MCHC: 33.9 g/dL (ref 30.0–36.0)
MCV: 93.9 fL (ref 78.0–100.0)
MONO ABS: 0.2 10*3/uL (ref 0.1–1.0)
Monocytes Relative: 5 % (ref 3–12)
Neutro Abs: 1.5 10*3/uL — ABNORMAL LOW (ref 1.7–7.7)
Neutrophils Relative %: 32 % — ABNORMAL LOW (ref 43–77)
Platelets: 341 10*3/uL (ref 150–400)
RBC: 4.08 MIL/uL (ref 3.87–5.11)
RDW: 13.2 % (ref 11.5–15.5)
WBC: 4.7 10*3/uL (ref 4.0–10.5)

## 2013-12-24 LAB — BASIC METABOLIC PANEL
Anion gap: 11 (ref 5–15)
BUN: 7 mg/dL (ref 6–23)
CO2: 24 mEq/L (ref 19–32)
CREATININE: 1.01 mg/dL (ref 0.50–1.10)
Calcium: 9.3 mg/dL (ref 8.4–10.5)
Chloride: 106 mEq/L (ref 96–112)
GFR, EST AFRICAN AMERICAN: 68 mL/min — AB (ref >90)
GFR, EST NON AFRICAN AMERICAN: 59 mL/min — AB (ref >90)
GLUCOSE: 104 mg/dL — AB (ref 70–99)
Potassium: 4 mEq/L (ref 3.7–5.3)
Sodium: 141 mEq/L (ref 137–147)

## 2013-12-24 LAB — URINALYSIS, ROUTINE W REFLEX MICROSCOPIC
Glucose, UA: NEGATIVE mg/dL
Hgb urine dipstick: NEGATIVE
Leukocytes, UA: NEGATIVE
Nitrite: NEGATIVE
PROTEIN: NEGATIVE mg/dL
Specific Gravity, Urine: >1.03 — ABNORMAL HIGH (ref 1.005–1.030)
UROBILINOGEN UA: 0.2 mg/dL (ref 0.0–1.0)
pH: 5 (ref 5.0–8.0)

## 2013-12-24 LAB — POC OCCULT BLOOD, ED: Fecal Occult Bld: NEGATIVE

## 2013-12-24 NOTE — Discharge Instructions (Signed)

## 2013-12-24 NOTE — ED Notes (Signed)
Pt reports seeing bright red blood in stool off and on for the past 2 weeks.  Reports was more significant today.  Denies n/v, abd pain, or diarrhea.  Denies any weakness but says feels a little lightheaded.

## 2013-12-24 NOTE — ED Provider Notes (Signed)
CSN: [id number]     Arrival date & time 12/24/13  1412 History  This chart was scribed for [employer]. Alvino Chapel, MD by Tula Nakayama, ED Scribe. This patient was seen in room [facility]14/[facility]14 and the patient's care was started at 2:45 PM.    Chief Complaint  Patient presents with  . Rectal Bleeding   The history is provided by the patient. No language interpreter was used.    HPI Comments: Tammy Austin is a 63 y.o. female who presents to the Emergency Department complaining of intermittent blood in her stool that started 2 weeks ago. She notes light-headedness as an associated symptom. Pt states she has been having normal stool accompanied by blood, but that blood increased today. Blood was present in the commode and within stool itself. She is currently taking anti-coagulants BID following hand surgery for carpal tunnel. She tried decreasing anti-coagulants with no relief. Pt denies a history of similar symptoms. She has had a colonoscopy which she notes was unremarkable. Pt denies bruising easily, blood with teeth brushing, CP and SOB as associated symptoms.    Past Medical History  Diagnosis Date  . Hypertension   . Reflux   . Diverticular disease   . Arthritis     generalized OA  . Carpal tunnel syndrome   . GERD (gastroesophageal reflux disease)   . Anxiety    Past Surgical History  Procedure Laterality Date  . Abdominal hysterectomy    . Tonsillectomy    . Cholecystectomy    . Carpal tunnel release Left 10/25/2013    Procedure: CARPAL TUNNEL RELEASE;  Surgeon: Carole Civil, MD;  Location: [facility];  Service: Orthopedics;  Laterality: Left;   Family History  Problem Relation Age of Onset  . Arthritis Mother   . Diabetes Father   . Hypertension Sister   . Depression Paternal Uncle    History  Substance Use Topics  . Smoking status: Current Some Day Smoker -- 0.25 packs/day for 20 years    Types: Cigarettes, E-cigarettes    Start date: 09/20/1970    Last Attempt to  Quit: 03/19/2013  . Smokeless tobacco: Former Systems developer    Quit date: 06/12/2013     Comment: trying to quit with electronic cigarette  . Alcohol Use: No     Comment: (1) beer per day-states stopped 06/2013   OB History    No data available     Review of Systems  Respiratory: Negative for shortness of breath.   Cardiovascular: Negative for chest pain.  Gastrointestinal: Positive for blood in stool.  Neurological: Positive for light-headedness.  Hematological: Does not bruise/bleed easily.  All other systems reviewed and are negative.     Allergies  Ibuprofen  Home Medications   Prior to Admission medications   Medication Sig Start Date End Date Taking? Authorizing Provider  amLODipine (NORVASC) 5 MG tablet Take 5 mg by mouth daily.   Yes Historical Provider, MD  gabapentin (NEURONTIN) 300 MG capsule Take 300-600 mg by mouth 2 (two) times daily. 300 mg in the morning and 600 mg at bedtime.   Yes Historical Provider, MD  Magnesium 200 MG TABS Take 200 mg by mouth daily.    Yes Historical Provider, MD  metoprolol tartrate (LOPRESSOR) 25 MG tablet Take 1 tablet (25 mg total) by mouth 2 (two) times daily. 09/19/13  Yes Lendon Colonel, NP  nabumetone (RELAFEN) 500 MG tablet Take 1 tablet (500 mg total) by mouth 2 (two) times daily. 12/05/13  Yes Dorothyann Peng  Vela Prose, MD  omeprazole (PRILOSEC) 20 MG capsule TAKE ONE CAPSULE BY MOUTH ONCE DAILY 09/20/13  Yes Alycia Rossetti, MD  senna-docusate (SENOKOT-S) 8.6-50 MG per tablet Take 4-5 tablets by mouth daily as needed for mild constipation or moderate constipation.    Yes Historical Provider, MD  diazepam (VALIUM) 10 MG tablet Take 1 tablet (10 mg total) by mouth every 6 (six) hours as needed (Muscle spasm). Patient not taking: Reported on 12/24/2013 09/03/13   Richarda Blade, MD  HYDROcodone-acetaminophen (NORCO/VICODIN) 5-325 MG per tablet Take 1 tablet by mouth every 6 (six) hours as needed for moderate pain. Patient not taking: Reported on  12/24/2013 11/07/13   Carole Civil, MD   BP 138/95 mmHg  Pulse 67  Temp(Src) 98.8 F (37.1 C)  Resp 20  Ht 5\' 4"  (1.626 m)  Wt 180 lb (81.647 kg)  BMI 30.88 kg/m2  SpO2 100% Physical Exam  Constitutional: She appears well-developed and well-nourished. No distress.  HENT:  Head: Normocephalic and atraumatic.  Slightly pale on sclera  Eyes: Conjunctivae and EOM are normal.  Neck: Neck supple. No tracheal deviation present. No thyromegaly present.  Cardiovascular: Normal rate and regular rhythm.   Pulmonary/Chest: Effort normal. No respiratory distress.  Abdominal: She exhibits no mass. There is no tenderness.  Musculoskeletal: She exhibits no edema.  Skin: Skin is warm and dry.  Psychiatric: She has a normal mood and affect. Her behavior is normal.  Nursing note and vitals reviewed.   ED Course  Procedures (including critical care time)  COORDINATION OF CARE: 2:52 PM Discussed treatment plan with pt which includes lab work and an EKG. Pt agreed to plan.    Labs Review Labs Reviewed  CBC WITH DIFFERENTIAL - Abnormal; Notable for the following:    Neutrophils Relative % 32 (*)    Neutro Abs 1.5 (*)    Lymphocytes Relative 59 (*)    All other components within normal limits  BASIC METABOLIC PANEL - Abnormal; Notable for the following:    Glucose, Bld 104 (*)    GFR calc non Af Amer 59 (*)    GFR calc Af Amer 68 (*)    All other components within normal limits  URINALYSIS, ROUTINE W REFLEX MICROSCOPIC - Abnormal; Notable for the following:    Specific Gravity, Urine >1.030 (*)    Bilirubin Urine SMALL (*)    Ketones, ur TRACE (*)    All other components within normal limits  POC OCCULT BLOOD, ED    Imaging Review No results found.   EKG Interpretation None      MDM   Final diagnoses:  Gastrointestinal hemorrhage, unspecified gastritis, unspecified gastrointestinal hemorrhage type    Patient with possible GI bleed. Lab work reassuring.  Guaiac-negative but easily could have had bleeding at home. Will discharge home to follow with gastroenterology. Patient states she has been on blood thinners, but unable to find any evidence of this. I personally performed the services described in this documentation, which was scribed in my presence. The recorded information has been reviewed and is accurate.      Jasper Riling. Alvino Chapel, MD 12/24/13 [phone]

## 2013-12-26 ENCOUNTER — Other Ambulatory Visit: Payer: Self-pay | Admitting: Family Medicine

## 2013-12-26 DIAGNOSIS — Z1231 Encounter for screening mammogram for malignant neoplasm of breast: Secondary | ICD-10-CM

## 2014-01-01 ENCOUNTER — Ambulatory Visit (HOSPITAL_COMMUNITY)
Admission: RE | Admit: 2014-01-01 | Discharge: 2014-01-01 | Disposition: A | Discharge status: Home / Self Care | Payer: PRIVATE HEALTH INSURANCE | Source: Ambulatory Visit | Attending: Family Medicine | Admitting: Family Medicine

## 2014-01-01 DIAGNOSIS — R928 Other abnormal and inconclusive findings on diagnostic imaging of breast: Secondary | ICD-10-CM | POA: Insufficient documentation

## 2014-01-01 DIAGNOSIS — Z1231 Encounter for screening mammogram for malignant neoplasm of breast: Secondary | ICD-10-CM | POA: Insufficient documentation

## 2014-01-06 ENCOUNTER — Other Ambulatory Visit: Payer: Self-pay | Admitting: Family Medicine

## 2014-01-06 DIAGNOSIS — R928 Other abnormal and inconclusive findings on diagnostic imaging of breast: Secondary | ICD-10-CM

## 2014-01-14 ENCOUNTER — Ambulatory Visit (HOSPITAL_COMMUNITY)
Admission: RE | Admit: 2014-01-14 | Discharge: 2014-01-14 | Disposition: A | Discharge status: Home / Self Care | Payer: PRIVATE HEALTH INSURANCE | Source: Ambulatory Visit | Attending: Family Medicine | Admitting: Family Medicine

## 2014-01-14 DIAGNOSIS — R928 Other abnormal and inconclusive findings on diagnostic imaging of breast: Secondary | ICD-10-CM

## 2014-02-03 ENCOUNTER — Other Ambulatory Visit: Payer: Self-pay

## 2014-02-03 ENCOUNTER — Encounter: Payer: Self-pay | Admitting: Nurse Practitioner

## 2014-02-03 ENCOUNTER — Ambulatory Visit (INDEPENDENT_AMBULATORY_CARE_PROVIDER_SITE_OTHER): Payer: Medicare Other | Admitting: Nurse Practitioner

## 2014-02-03 VITALS — BP 137/91 | HR 83 | Temp 97.3°F | Ht 65.0 in | Wt 181.8 lb

## 2014-02-03 DIAGNOSIS — K219 Gastro-esophageal reflux disease without esophagitis: Secondary | ICD-10-CM

## 2014-02-03 DIAGNOSIS — K2971 Gastritis, unspecified, with bleeding: Secondary | ICD-10-CM

## 2014-02-03 DIAGNOSIS — K922 Gastrointestinal hemorrhage, unspecified: Secondary | ICD-10-CM

## 2014-02-03 MED ORDER — PEG-KCL-NACL-NASULF-NA ASC-C 100 G PO SOLR: 1.0000 | ORAL | Status: DC

## 2014-02-03 NOTE — Patient Instructions (Addendum)
1. We will schedule your procedure (endoscopy and colonoscopy) with Dr. Gala Romney. 2. I will check your blood work for any signs of excessive bleeding 3. I will increase your Prilosec (acid blocker) to 20 mg twice a day 4. Further recommendations to be based on the results of the procedure.

## 2014-02-03 NOTE — Assessment & Plan Note (Signed)
History of GERD currently on PPI Prilosec 20mg  daily. However admits recurrent sysmptoms, describes what could be melena, and is currently on an NSAID s/p carpel tunnel release. May be recurrent gastritis but cannot exclude severe gastritis or upper GI bleed. Will proceed with EGD with RMR to further evaluate.  Proceed with TCS and EGD with Dr. Gala Romney in near future: the risks, benefits, and alternatives have been discussed with the patient in detail. The patient states understanding and desires to proceed.

## 2014-02-03 NOTE — Progress Notes (Signed)
Primary Care Physician:  Vic Blackbird, MD Primary Gastroenterologist:  Dr. Gala Romney  Chief Complaint  Patient presents with  . Rectal Bleeding  . Constipation  . Abdominal Pain  . Bloated    HPI:   64 year old female presents for ED follow-up for rectal bleeding and associated light-headedness. Patient presented to the ER 12/24/13 complaining of intermittent blood in her stool for the 2 weeks prior to her visit, claimed she also was on blood thinners which she tried to decrease on her own with no relief. States the anticoagulants are after carpel tunnel surgery. States she has had a colonoscopy with normal results, but no record of this is available in [facility] at this time. In the ER her stool was guaiac negative and labs essentially normal with H/H 13.0/38.3. Is currently on Relafen (NSAID) bid and Prilosec 20 mg daily.  States she has had no further bleeding since being to the ER. When she was bleeding states she was having bright red obvious blood which was on and in the stool, some on the tissue paper. No known history of hemorrhoids. Also states she's had some "dark sticky" stools, last incident was yesterday and happens about once every 2-3 days. Was not really having the melena-type symptom when she was in the ER. Denies iron supplementation or bismuth use. Denies dysphagia, admits recurrent GERD symptoms even on Protonix. Admits mid-abdominal tenderness immediately after a bowel movement. Typically has a bowel movement 1-3 times a day. Stools typically a 3-4 on Bristol Stool Scale unless she hasn't taken her stool softener. Admits taking Naproxen daily. Denies ASA powders. Denies any other GI symptoms.  Past Medical History  Diagnosis Date  . Hypertension   . Reflux   . Diverticular disease   . Arthritis     generalized OA  . Carpal tunnel syndrome   . GERD (gastroesophageal reflux disease)   . Anxiety     Past Surgical History  Procedure Laterality Date  . Abdominal  hysterectomy    . Tonsillectomy    . Cholecystectomy    . Carpal tunnel release Left 10/25/2013    Procedure: CARPAL TUNNEL RELEASE;  Surgeon: Carole Civil, MD;  Location: [facility];  Service: Orthopedics;  Laterality: Left;    Current Outpatient Prescriptions  Medication Sig Dispense Refill  . amLODipine (NORVASC) 5 MG tablet Take 5 mg by mouth daily.    . diazepam (VALIUM) 10 MG tablet Take 1 tablet (10 mg total) by mouth every 6 (six) hours as needed (Muscle spasm). 20 tablet 0  . gabapentin (NEURONTIN) 300 MG capsule Take 300-600 mg by mouth 2 (two) times daily. 300 mg in the morning and 600 mg at bedtime.    Marland Kitchen HYDROcodone-acetaminophen (NORCO/VICODIN) 5-325 MG per tablet Take 1 tablet by mouth every 6 (six) hours as needed for moderate pain. 60 tablet 0  . Magnesium 200 MG TABS Take 200 mg by mouth daily.     . metoprolol tartrate (LOPRESSOR) 25 MG tablet Take 1 tablet (25 mg total) by mouth 2 (two) times daily. 60 tablet 3  . nabumetone (RELAFEN) 500 MG tablet Take 1 tablet (500 mg total) by mouth 2 (two) times daily. 60 tablet 5  . omeprazole (PRILOSEC) 20 MG capsule TAKE ONE CAPSULE BY MOUTH ONCE DAILY 30 capsule 11  . senna-docusate (SENOKOT-S) 8.6-50 MG per tablet Take 4-5 tablets by mouth daily as needed for mild constipation or moderate constipation.      No current facility-administered medications for this  visit.    Allergies as of 02/03/2014 - Review Complete 02/03/2014  Allergen Reaction Noted  . Ibuprofen  12/20/2007    Family History  Problem Relation Age of Onset  . Arthritis Mother   . Diabetes Father   . Hypertension Sister   . Depression Paternal Uncle     History   Social History  . Marital Status: Widowed    Spouse Name: N/A    Number of Children: N/A  . Years of Education: N/A   Occupational History  . Not on file.   Social History Main Topics  . Smoking status: Former Smoker -- 0.00 packs/day for 20 years    Types: Cigarettes, E-cigarettes      Start date: 09/20/1970    Quit date: 07/17/2013  . Smokeless tobacco: Former Systems developer    Quit date: 06/12/2013     Comment: trying to quit with electronic cigarette  . Alcohol Use: No     Comment: (1) beer per day-states stopped 06/2013  . Drug Use: No  . Sexual Activity: Yes    Birth Control/ Protection: Surgical   Other Topics Concern  . Not on file   Social History Narrative    Review of Systems: Gen: Denies any fever, chills, fatigue, weight loss. Admits decreased appetite.  CV: Denies chest pain, heart palpitations, peripheral edema, syncope.  Resp: Denies shortness of breath at rest or with exertion. Denies wheezing or cough. Denies dyspnea while laying flat. GI: See HPI. Denies jaundice, hematemesis. MS: Denies joint pain, muscle weakness, cramps, or limitation of movement.  Derm: Denies rash, itching, dry skin Psych: Denies depression, anxiety, memory loss, and confusion Heme: Denies bruising, bleeding, and enlarged lymph nodes.  Physical Exam: BP 137/91 mmHg  Pulse 83  Temp(Src) 97.3 F (36.3 C)  Ht 5\' 5"  (1.651 m)  Wt 181 lb 12.8 oz (82.464 kg)  BMI 30.25 kg/m2 General:   Alert and oriented. Pleasant and cooperative. Well-nourished and well-developed.  Head:  Normocephalic and atraumatic. Eyes:  Without icterus, sclera clear and conjunctiva pink.  Ears:  Normal auditory acuity. Mouth:  No deformity or lesions, oral mucosa pink. No OP edema. Neck:  Supple, without mass or thyromegaly. Lungs:  Clear to auscultation bilaterally. No wheezes, rales, or rhonchi. No distress.  Heart:  S1, S2 present without murmurs appreciated.  Abdomen:  +BS, rounded, soft, and non-distended. Mild left sided abdominal TTP. No HSM noted. No guarding or rebound. No masses appreciated.  Rectal:  Deferred  Msk:  Symmetrical without gross deformities. Normal posture. Pulses:  Normal DP pulses noted. Extremities:  Without clubbing or edema. Neurologic:  Alert and  oriented x4;  grossly  normal neurologically. Skin:  Intact without significant lesions or rashes. Cervical Nodes:  No significant cervical adenopathy. Psych:  Alert and cooperative. Normal mood and affect.     02/03/2014 2:35 PM

## 2014-02-03 NOTE — Progress Notes (Signed)
cc'ed to pcp °

## 2014-02-03 NOTE — Assessment & Plan Note (Signed)
Patient describes what is possible melena as well as brbpr. Has had a colonoscopy "I think 3-4 years ago and it was normal" but cannot recall where and by who. No record fo the procedure in [facility] therefore unknown findings. Maternal aunt with CRC. Bright red blood likely benign anorectal source but cannot exclude more insidious process. Will proceed with colonoscopy with Dr. Gala Romney to further evaluate.  Proceed with TCS and EGD with Dr. Gala Romney in near future: the risks, benefits, and alternatives have been discussed with the patient in detail. The patient states understanding and desires to proceed.

## 2014-02-04 LAB — CBC
HCT: 36.7 % (ref 36.0–46.0)
Hemoglobin: 12.2 g/dL (ref 12.0–15.0)
MCH: 31.4 pg (ref 26.0–34.0)
MCHC: 33.2 g/dL (ref 30.0–36.0)
MCV: 94.3 fL (ref 78.0–100.0)
MPV: 9.9 fL (ref 8.6–12.4)
Platelets: 335 10*3/uL (ref 150–400)
RBC: 3.89 MIL/uL (ref 3.87–5.11)
RDW: 14.2 % (ref 11.5–15.5)
WBC: 5.6 10*3/uL (ref 4.0–10.5)

## 2014-02-17 ENCOUNTER — Other Ambulatory Visit: Payer: Self-pay | Admitting: Adult Health

## 2014-02-17 MED ORDER — METOPROLOL TARTRATE 25 MG PO TABS: 25.0000 mg | ORAL_TABLET | Freq: Two times a day (BID) | ORAL | Status: DC

## 2014-02-17 NOTE — Telephone Encounter (Signed)
Received fax refill request  Rx # [id number]  Medication:  Metoprolol Tartrate 25 mg  Qty 60 Sig:  Take one tablet by mouth twice daily Physician:  Purcell Nails

## 2014-02-20 ENCOUNTER — Ambulatory Visit (HOSPITAL_COMMUNITY)
Admission: RE | Admit: 2014-02-20 | Discharge: 2014-02-20 | Disposition: A | Discharge status: Home / Self Care | Payer: Medicare Other | Source: Ambulatory Visit | Attending: Internal Medicine | Admitting: Internal Medicine

## 2014-02-20 ENCOUNTER — Encounter (HOSPITAL_COMMUNITY): Payer: Self-pay | Admitting: *Deleted

## 2014-02-20 ENCOUNTER — Encounter (HOSPITAL_COMMUNITY)
Admission: RE | Disposition: A | Discharge status: Home / Self Care | Payer: Self-pay | Source: Ambulatory Visit | Attending: Internal Medicine

## 2014-02-20 DIAGNOSIS — K449 Diaphragmatic hernia without obstruction or gangrene: Secondary | ICD-10-CM | POA: Insufficient documentation

## 2014-02-20 DIAGNOSIS — K5730 Diverticulosis of large intestine without perforation or abscess without bleeding: Secondary | ICD-10-CM | POA: Insufficient documentation

## 2014-02-20 DIAGNOSIS — M159 Polyosteoarthritis, unspecified: Secondary | ICD-10-CM | POA: Diagnosis not present

## 2014-02-20 DIAGNOSIS — I10 Essential (primary) hypertension: Secondary | ICD-10-CM | POA: Insufficient documentation

## 2014-02-20 DIAGNOSIS — K921 Melena: Secondary | ICD-10-CM

## 2014-02-20 DIAGNOSIS — K209 Esophagitis, unspecified: Secondary | ICD-10-CM

## 2014-02-20 DIAGNOSIS — K210 Gastro-esophageal reflux disease with esophagitis: Secondary | ICD-10-CM | POA: Diagnosis not present

## 2014-02-20 DIAGNOSIS — K219 Gastro-esophageal reflux disease without esophagitis: Secondary | ICD-10-CM

## 2014-02-20 DIAGNOSIS — Z87891 Personal history of nicotine dependence: Secondary | ICD-10-CM | POA: Diagnosis not present

## 2014-02-20 DIAGNOSIS — K2971 Gastritis, unspecified, with bleeding: Secondary | ICD-10-CM

## 2014-02-20 HISTORY — PX: COLONOSCOPY: SHX5424

## 2014-02-20 HISTORY — PX: ESOPHAGOGASTRODUODENOSCOPY: SHX5428

## 2014-02-20 LAB — HEMOGLOBIN AND HEMATOCRIT, BLOOD
HEMATOCRIT: 35.9 % — AB (ref 36.0–46.0)
Hemoglobin: 11.8 g/dL — ABNORMAL LOW (ref 12.0–15.0)

## 2014-02-20 SURGERY — EGD (ESOPHAGOGASTRODUODENOSCOPY)
Anesthesia: Moderate Sedation

## 2014-02-20 MED ORDER — ONDANSETRON HCL 4 MG/2ML IJ SOLN
INTRAMUSCULAR | Status: AC
  Filled 2014-02-20: qty 2

## 2014-02-20 MED ORDER — LIDOCAINE VISCOUS 2 % MT SOLN
OROMUCOSAL | Status: AC
  Filled 2014-02-20: qty 15

## 2014-02-20 MED ORDER — MEPERIDINE HCL 100 MG/ML IJ SOLN
INTRAMUSCULAR | Status: DC | PRN
  Administered 2014-02-20: 50 mg via INTRAVENOUS
  Administered 2014-02-20 (×2): 25 mg via INTRAVENOUS

## 2014-02-20 MED ORDER — MEPERIDINE HCL 100 MG/ML IJ SOLN
INTRAMUSCULAR | Status: AC
  Filled 2014-02-20: qty 2

## 2014-02-20 MED ORDER — STERILE WATER FOR IRRIGATION IR SOLN
Status: DC | PRN
  Administered 2014-02-20: 13:00:00

## 2014-02-20 MED ORDER — LIDOCAINE VISCOUS 2 % MT SOLN
OROMUCOSAL | Status: DC | PRN
  Administered 2014-02-20: 3 mL via OROMUCOSAL

## 2014-02-20 MED ORDER — MIDAZOLAM HCL 5 MG/5ML IJ SOLN
INTRAMUSCULAR | Status: DC | PRN
  Administered 2014-02-20 (×2): 2 mg via INTRAVENOUS
  Administered 2014-02-20: 1 mg via INTRAVENOUS

## 2014-02-20 MED ORDER — MIDAZOLAM HCL 5 MG/5ML IJ SOLN
INTRAMUSCULAR | Status: AC
  Filled 2014-02-20: qty 10

## 2014-02-20 MED ORDER — ONDANSETRON HCL 4 MG/2ML IJ SOLN
INTRAMUSCULAR | Status: DC | PRN
  Administered 2014-02-20: 4 mg via INTRAVENOUS

## 2014-02-20 MED ORDER — SODIUM CHLORIDE 0.9 % IV SOLN
INTRAVENOUS | Status: DC
  Administered 2014-02-20: 13:00:00 via INTRAVENOUS

## 2014-02-20 NOTE — Op Note (Signed)
[facility] [address] [place], [address]   ENDOSCOPY PROCEDURE REPORT  PATIENT: Tammy, Austin  MR#: [id number] BIRTHDATE: Jun 04, 1950 , 63  yrs. old GENDER: female ENDOSCOPIST: R.  Garfield Cornea, MD FACP FACG REFERRED BY:  Vic Blackbird, M.D. PROCEDURE DATE:  02/22/14 PROCEDURE:  EGD, diagnostic INDICATIONS:  reported melena; new onset GERD. MEDICATIONS: Versed 4 mg IV and Demerol 75 mg IV in divided doses. Xylocaine gel orally.  Zofran 4 mg IV. ASA CLASS:      Class II  CONSENT: The risks, benefits, limitations, alternatives and imponderables have been discussed.  The potential for biopsy, esophogeal dilation, etc. have also been reviewed.  Questions have been answered.  All parties agreeable.  Please see the history and physical in the medical record for more information.  DESCRIPTION OF PROCEDURE: After the risks benefits and alternatives of the procedure were thoroughly explained, informed consent was obtained.  The EG-[id number] ([id number]) endoscope was introduced through the mouth and advanced to the second portion of the duodenum , limited by Without limitations. The instrument was slowly withdrawn as the mucosa was fully examined.    2 quadrant 6 mm distal esophageal erosions.  Patulous EG junction. No Barrett's esophagus.  Stomach empty.  Small hiatal hernia. Normal gastric mucosa.  Patent pylorus.  Normal first and second portion of the duodenum.  Retroflexed views revealed as previously described.     The scope was then withdrawn from the patient and the procedure completed.  COMPLICATIONS: There were no immediate complications.  ENDOSCOPIC IMPRESSION: Erosive reflux esophagitis. Patulous EG junction. Small hiatal hernia.  RECOMMENDATIONS: Stop Prilosec; begin Dexilant 60 mg daily. See colonoscopy report.  REPEAT EXAM:  eSigned:  R. Garfield Cornea, MD Rosalita Chessman [facility] 22-Feb-2014 1:40 PM    CC:  CPT CODES: ICD CODES:  The ICD and CPT  codes recommended by this software are interpretations from the data that the clinical staff has captured with the software.  The verification of the translation of this report to the ICD and CPT codes and modifiers is the sole responsibility of the health care institution and practicing physician where this report was generated.  [name] will not be held responsible for the validity of the ICD and CPT codes included on this report.  AMA assumes no liability for data contained or not contained herein. CPT is a Designer, television/film set of the [employer].  PATIENT NAME:  Tammy, Austin MR#: [id number]

## 2014-02-20 NOTE — H&P (View-Only) (Signed)
Primary Care Physician:  Vic Blackbird, MD Primary Gastroenterologist:  Dr. Gala Romney  Chief Complaint  Patient presents with  . Rectal Bleeding  . Constipation  . Abdominal Pain  . Bloated    HPI:   64 year old female presents for ED follow-up for rectal bleeding and associated light-headedness. Patient presented to the ER 12/24/13 complaining of intermittent blood in her stool for the 2 weeks prior to her visit, claimed she also was on blood thinners which she tried to decrease on her own with no relief. States the anticoagulants are after carpel tunnel surgery. States she has had a colonoscopy with normal results, but no record of this is available in [facility] at this time. In the ER her stool was guaiac negative and labs essentially normal with H/H 13.0/38.3. Is currently on Relafen (NSAID) bid and Prilosec 20 mg daily.  States she has had no further bleeding since being to the ER. When she was bleeding states she was having bright red obvious blood which was on and in the stool, some on the tissue paper. No known history of hemorrhoids. Also states she's had some "dark sticky" stools, last incident was yesterday and happens about once every 2-3 days. Was not really having the melena-type symptom when she was in the ER. Denies iron supplementation or bismuth use. Denies dysphagia, admits recurrent GERD symptoms even on Protonix. Admits mid-abdominal tenderness immediately after a bowel movement. Typically has a bowel movement 1-3 times a day. Stools typically a 3-4 on Bristol Stool Scale unless she hasn't taken her stool softener. Admits taking Naproxen daily. Denies ASA powders. Denies any other GI symptoms.  Past Medical History  Diagnosis Date  . Hypertension   . Reflux   . Diverticular disease   . Arthritis     generalized OA  . Carpal tunnel syndrome   . GERD (gastroesophageal reflux disease)   . Anxiety     Past Surgical History  Procedure Laterality Date  . Abdominal  hysterectomy    . Tonsillectomy    . Cholecystectomy    . Carpal tunnel release Left 10/25/2013    Procedure: CARPAL TUNNEL RELEASE;  Surgeon: Carole Civil, MD;  Location: [facility];  Service: Orthopedics;  Laterality: Left;    Current Outpatient Prescriptions  Medication Sig Dispense Refill  . amLODipine (NORVASC) 5 MG tablet Take 5 mg by mouth daily.    . diazepam (VALIUM) 10 MG tablet Take 1 tablet (10 mg total) by mouth every 6 (six) hours as needed (Muscle spasm). 20 tablet 0  . gabapentin (NEURONTIN) 300 MG capsule Take 300-600 mg by mouth 2 (two) times daily. 300 mg in the morning and 600 mg at bedtime.    Marland Kitchen HYDROcodone-acetaminophen (NORCO/VICODIN) 5-325 MG per tablet Take 1 tablet by mouth every 6 (six) hours as needed for moderate pain. 60 tablet 0  . Magnesium 200 MG TABS Take 200 mg by mouth daily.     . metoprolol tartrate (LOPRESSOR) 25 MG tablet Take 1 tablet (25 mg total) by mouth 2 (two) times daily. 60 tablet 3  . nabumetone (RELAFEN) 500 MG tablet Take 1 tablet (500 mg total) by mouth 2 (two) times daily. 60 tablet 5  . omeprazole (PRILOSEC) 20 MG capsule TAKE ONE CAPSULE BY MOUTH ONCE DAILY 30 capsule 11  . senna-docusate (SENOKOT-S) 8.6-50 MG per tablet Take 4-5 tablets by mouth daily as needed for mild constipation or moderate constipation.      No current facility-administered medications for this  visit.    Allergies as of 02/03/2014 - Review Complete 02/03/2014  Allergen Reaction Noted  . Ibuprofen  12/20/2007    Family History  Problem Relation Age of Onset  . Arthritis Mother   . Diabetes Father   . Hypertension Sister   . Depression Paternal Uncle     History   Social History  . Marital Status: Widowed    Spouse Name: N/A    Number of Children: N/A  . Years of Education: N/A   Occupational History  . Not on file.   Social History Main Topics  . Smoking status: Former Smoker -- 0.00 packs/day for 20 years    Types: Cigarettes, E-cigarettes      Start date: 09/20/1970    Quit date: 07/17/2013  . Smokeless tobacco: Former Systems developer    Quit date: 06/12/2013     Comment: trying to quit with electronic cigarette  . Alcohol Use: No     Comment: (1) beer per day-states stopped 06/2013  . Drug Use: No  . Sexual Activity: Yes    Birth Control/ Protection: Surgical   Other Topics Concern  . Not on file   Social History Narrative    Review of Systems: Gen: Denies any fever, chills, fatigue, weight loss. Admits decreased appetite.  CV: Denies chest pain, heart palpitations, peripheral edema, syncope.  Resp: Denies shortness of breath at rest or with exertion. Denies wheezing or cough. Denies dyspnea while laying flat. GI: See HPI. Denies jaundice, hematemesis. MS: Denies joint pain, muscle weakness, cramps, or limitation of movement.  Derm: Denies rash, itching, dry skin Psych: Denies depression, anxiety, memory loss, and confusion Heme: Denies bruising, bleeding, and enlarged lymph nodes.  Physical Exam: BP 137/91 mmHg  Pulse 83  Temp(Src) 97.3 F (36.3 C)  Ht 5\' 5"  (1.651 m)  Wt 181 lb 12.8 oz (82.464 kg)  BMI 30.25 kg/m2 General:   Alert and oriented. Pleasant and cooperative. Well-nourished and well-developed.  Head:  Normocephalic and atraumatic. Eyes:  Without icterus, sclera clear and conjunctiva pink.  Ears:  Normal auditory acuity. Mouth:  No deformity or lesions, oral mucosa pink. No OP edema. Neck:  Supple, without mass or thyromegaly. Lungs:  Clear to auscultation bilaterally. No wheezes, rales, or rhonchi. No distress.  Heart:  S1, S2 present without murmurs appreciated.  Abdomen:  +BS, rounded, soft, and non-distended. Mild left sided abdominal TTP. No HSM noted. No guarding or rebound. No masses appreciated.  Rectal:  Deferred  Msk:  Symmetrical without gross deformities. Normal posture. Pulses:  Normal DP pulses noted. Extremities:  Without clubbing or edema. Neurologic:  Alert and  oriented x4;  grossly  normal neurologically. Skin:  Intact without significant lesions or rashes. Cervical Nodes:  No significant cervical adenopathy. Psych:  Alert and cooperative. Normal mood and affect.     02/03/2014 2:35 PM

## 2014-02-20 NOTE — Discharge Instructions (Signed)
Colonoscopy Discharge Instructions  Read the instructions outlined below and refer to this sheet in the next few weeks. These discharge instructions provide you with general information on caring for yourself after you leave the hospital. Your doctor may also give you specific instructions. While your treatment has been planned according to the most current medical practices available, unavoidable complications occasionally occur. If you have any problems or questions after discharge, call Dr. Gala Romney at [phone]. ACTIVITY  You may resume your regular activity, but move at a slower pace for the next 24 hours.   Take frequent rest periods for the next 24 hours.   Walking will help get rid of the air and reduce the bloated feeling in your belly (abdomen).   No driving for 24 hours (because of the medicine (anesthesia) used during the test).    Do not sign any important legal documents or operate any machinery for 24 hours (because of the anesthesia used during the test).  NUTRITION  Drink plenty of fluids.   You may resume your normal diet as instructed by your doctor.   Begin with a light meal and progress to your normal diet. Heavy or fried foods are harder to digest and may make you feel sick to your stomach (nauseated).   Avoid alcoholic beverages for 24 hours or as instructed.  MEDICATIONS  You may resume your normal medications unless your doctor tells you otherwise.  WHAT YOU CAN EXPECT TODAY  Some feelings of bloating in the abdomen.   Passage of more gas than usual.   Spotting of blood in your stool or on the toilet paper.  IF YOU HAD POLYPS REMOVED DURING THE COLONOSCOPY:  No aspirin products for 7 days or as instructed.   No alcohol for 7 days or as instructed.   Eat a soft diet for the next 24 hours.  FINDING OUT THE RESULTS OF YOUR TEST Not all test results are available during your visit. If your test results are not back during the visit, make an appointment  with your caregiver to find out the results. Do not assume everything is normal if you have not heard from your caregiver or the medical facility. It is important for you to follow up on all of your test results.  SEEK IMMEDIATE MEDICAL ATTENTION IF:  You have more than a spotting of blood in your stool.   Your belly is swollen (abdominal distention).   You are nauseated or vomiting.   You have a temperature over 101.  You have abdominal pain or discomfort that is severe or gets worse throughout the day. EGD Discharge instructions Please read the instructions outlined below and refer to this sheet in the next few weeks. These discharge instructions provide you with general information on caring for yourself after you leave the hospital. Your doctor may also give you specific instructions. While your treatment has been planned according to the most current medical practices available, unavoidable complications occasionally occur. If you have any problems or questions after discharge, please call your doctor. ACTIVITY You may resume your regular activity but move at a slower pace for the next 24 hours.  Take frequent rest periods for the next 24 hours.  Walking will help expel (get rid of) the air and reduce the bloated feeling in your abdomen.  No driving for 24 hours (because of the anesthesia (medicine) used during the test).  You may shower.  Do not sign any important legal documents or operate any machinery for 24  hours (because of the anesthesia used during the test).  NUTRITION Drink plenty of fluids.  You may resume your normal diet.  Begin with a light meal and progress to your normal diet.  Avoid alcoholic beverages for 24 hours or as instructed by your caregiver.  MEDICATIONS You may resume your normal medications unless your caregiver tells you otherwise.  WHAT YOU CAN EXPECT TODAY You may experience abdominal discomfort such as a feeling of fullness or gas pains.   FOLLOW-UP Your doctor will discuss the results of your test with you.  SEEK IMMEDIATE MEDICAL ATTENTION IF ANY OF THE FOLLOWING OCCUR: Excessive nausea (feeling sick to your stomach) and/or vomiting.  Severe abdominal pain and distention (swelling).  Trouble swallowing.  Temperature over 101 F (37.8 C).  Rectal bleeding or vomiting of blood.    GERD information.  Diverticulosis information.  Your colon was not cleaned out enough for good colonoscopy today  Stop Prilosec; begin Dexilant  60 mg daily for GERD  Office visit with [place] in one month  H&H today   Gastroesophageal Reflux Disease, Adult Gastroesophageal reflux disease (GERD) happens when acid from your stomach flows up into the esophagus. When acid comes in contact with the esophagus, the acid causes soreness (inflammation) in the esophagus. Over time, GERD may create small holes (ulcers) in the lining of the esophagus. CAUSES   Increased body weight. This puts pressure on the stomach, making acid rise from the stomach into the esophagus.  Smoking. This increases acid production in the stomach.  Drinking alcohol. This causes decreased pressure in the lower esophageal sphincter (valve or ring of muscle between the esophagus and stomach), allowing acid from the stomach into the esophagus.  Late evening meals and a full stomach. This increases pressure and acid production in the stomach.  A malformed lower esophageal sphincter. Sometimes, no cause is found. SYMPTOMS   Burning pain in the lower part of the mid-chest behind the breastbone and in the mid-stomach area. This may occur twice a week or more often.  Trouble swallowing.  Sore throat.  Dry cough.  Asthma-like symptoms including chest tightness, shortness of breath, or wheezing. DIAGNOSIS  Your caregiver may be able to diagnose GERD based on your symptoms. In some cases, X-rays and other tests may be done to check for complications or to check the  condition of your stomach and esophagus. TREATMENT  Your caregiver may recommend over-the-counter or prescription medicines to help decrease acid production. Ask your caregiver before starting or adding any new medicines.  HOME CARE INSTRUCTIONS   Change the factors that you can control. Ask your caregiver for guidance concerning weight loss, quitting smoking, and alcohol consumption.  Avoid foods and drinks that make your symptoms worse, such as:  Caffeine or alcoholic drinks.  Chocolate.  Peppermint or mint flavorings.  Garlic and onions.  Spicy foods.  Citrus fruits, such as oranges, lemons, or limes.  Tomato-based foods such as sauce, chili, salsa, and pizza.  Fried and fatty foods.  Avoid lying down for the 3 hours prior to your bedtime or prior to taking a nap.  Eat small, frequent meals instead of large meals.  Wear loose-fitting clothing. Do not wear anything tight around your waist that causes pressure on your stomach.  Raise the head of your bed 6 to 8 inches with wood blocks to help you sleep. Extra pillows will not help.  Only take over-the-counter or prescription medicines for pain, discomfort, or fever as directed by your caregiver.  Do not take aspirin, ibuprofen, or other nonsteroidal anti-inflammatory drugs (NSAIDs). SEEK IMMEDIATE MEDICAL CARE IF:   You have pain in your arms, neck, jaw, teeth, or back.  Your pain increases or changes in intensity or duration.  You develop nausea, vomiting, or sweating (diaphoresis).  You develop shortness of breath, or you faint.  Your vomit is green, yellow, black, or looks like coffee grounds or blood.  Your stool is red, bloody, or black. These symptoms could be signs of other problems, such as heart disease, gastric bleeding, or esophageal bleeding. MAKE SURE YOU:   Understand these instructions.  Will watch your condition.  Will get help right away if you are not doing well or get worse. Document  Released: 10/13/2004 Document Revised: 03/28/2011 Document Reviewed: 07/23/2010 [facility] Patient Information 2015 [place], [place]. This information is not intended to replace advice given to you by your health care provider. Make sure you discuss any questions you have with your health care provider.    Diverticulosis Diverticulosis is the condition that develops when small pouches (diverticula) form in the wall of your colon. Your colon, or large intestine, is where water is absorbed and stool is formed. The pouches form when the inside layer of your colon pushes through weak spots in the outer layers of your colon. CAUSES  No one knows exactly what causes diverticulosis. RISK FACTORS  Being older than 51. Your risk for this condition increases with age. Diverticulosis is rare in people younger than 40 years. By age 75, almost everyone has it.  Eating a low-fiber diet.  Being frequently constipated.  Being overweight.  Not getting enough exercise.  Smoking.  Taking over-the-counter pain medicines, like aspirin and ibuprofen. SYMPTOMS  Most people with diverticulosis do not have symptoms. DIAGNOSIS  Because diverticulosis often has no symptoms, health care providers often discover the condition during an exam for other colon problems. In many cases, a health care provider will diagnose diverticulosis while using a flexible scope to examine the colon (colonoscopy). TREATMENT  If you have never developed an infection related to diverticulosis, you may not need treatment. If you have had an infection before, treatment may include:  Eating more fruits, vegetables, and grains.  Taking a fiber supplement.  Taking a live bacteria supplement (probiotic).  Taking medicine to relax your colon. HOME CARE INSTRUCTIONS   Drink at least 6-8 glasses of water each day to prevent constipation.  Try not to strain when you have a bowel movement.  Keep all follow-up appointments. If you have  had an infection before:  Increase the fiber in your diet as directed by your health care provider or dietitian.  Take a dietary fiber supplement if your health care provider approves.  Only take medicines as directed by your health care provider. SEEK MEDICAL CARE IF:   You have abdominal pain.  You have bloating.  You have cramps.  You have not gone to the bathroom in 3 days. SEEK IMMEDIATE MEDICAL CARE IF:   Your pain gets worse.  Yourbloating becomes very bad.  You have a fever or chills, and your symptoms suddenly get worse.  You begin vomiting.  You have bowel movements that are bloody or black. MAKE SURE YOU:  Understand these instructions.  Will watch your condition.  Will get help right away if you are not doing well or get worse. Document Released: 10/01/2003 Document Revised: 01/08/2013 Document Reviewed: 11/28/2012 [facility] Patient Information 2015 [place], [place]. This information is not intended to replace advice given  to you by your health care provider. Make sure you discuss any questions you have with your health care provider.

## 2014-02-20 NOTE — Op Note (Signed)
[facility] [address] [place], [address]   COLONOSCOPY PROCEDURE REPORT  PATIENT: Tammy Austin, Tammy Austin  MR#: [id number] BIRTHDATE: Jun 18, 1950 , 63  yrs. old GENDER: female ENDOSCOPIST: R.  Garfield Cornea, MD FACP [facility] REFERRED LF:[id number] [place], M.D. PROCEDURE DATE:  2014/03/04 PROCEDURE:   Colonoscopy, diagnostic INDICATIONS:-hematochezia; normal hemoglobin December 2015. MEDICATIONS: Versed 5 mg IV and Demerol 100 mg IV in divided doses. Zofran 4 mg IV. ASA CLASS:       Class II  CONSENT: The risks, benefits, alternatives and imponderables including but not limited to bleeding, perforation as well as the possibility of a missed lesion have been reviewed.  The potential for biopsy, lesion removal, etc. have also been discussed. Questions have been answered.  All parties agreeable.  Please see the history and physical in the medical record for more information.  DESCRIPTION OF PROCEDURE:   After the risks benefits and alternatives of the procedure were thoroughly explained, informed consent was obtained.  The digital rectal exam revealed no rectal mass.   The [id number] ([id number])  endoscope was introduced through the anus and advanced to the cecum, which was identified by both the appendix and ileocecal valve. No adverse events experienced. The quality of the prep was inadequate       The instrument was then slowly withdrawn as the colon was fully examined.      COLON FINDINGS: Scattered pancolonic diverticula; a good part of the rectal and colonic mucosa not seen because of inadequate preparation today.  Retroflexion was performed. .  Withdrawal time=7 minutes 0 seconds.  The scope was withdrawn and the procedure completed. COMPLICATIONS: There were no immediate complications.  ENDOSCOPIC IMPRESSION: Pan?" colonic diverticulosis. Incomplete colonoscopy because of inadequate preparation  RECOMMENDATIONS: Follow up with [place] in one month. Would offer a  reschedule colonoscopy under propofol with an adequate preparation. H&H today.   See EGD report.  eSigned:  R. Garfield Cornea, MD Rosalita Chessman [facility] March 04, 2014 2:06 PM   cc:  CPT CODES: ICD CODES:  The ICD and CPT codes recommended by this software are interpretations from the data that the clinical staff has captured with the software.  The verification of the translation of this report to the ICD and CPT codes and modifiers is the sole responsibility of the health care institution and practicing physician where this report was generated.  [employer] will not be held responsible for the validity of the ICD and CPT codes included on this report.  AMA assumes no liability for data contained or not contained herein. CPT is a Designer, television/film set of the [employer].  PATIENT NAME:  Tammy Austin, Tammy Austin MR#: [id number]

## 2014-02-20 NOTE — Interval H&P Note (Signed)
History and Physical Interval Note:  02/20/2014 1:17 PM  Tammy Austin  has presented today for surgery, with the diagnosis of GI BLEED/ GERD  The various methods of treatment have been discussed with the patient and family. After consideration of risks, benefits and other options for treatment, the patient has consented to  Procedure(s) with comments: ESOPHAGOGASTRODUODENOSCOPY (EGD) (N/A) - 115 COLONOSCOPY (N/A) as a surgical intervention .  The patient's history has been reviewed, patient examined, no change in status, stable for surgery.  I have reviewed the patient's chart and labs.  Questions were answered to the patient's satisfaction.     New onset, refractory GERD, reported melena and hematochezia. It is noted recent guaiac is negative and hemoglobin remains normal no dysphagia. EGD and colonoscopy per plan.  The risks, benefits, limitations, imponderables and alternatives regarding both EGD and colonoscopy have been reviewed with the patient. Questions have been answered. All parties agreeable.    Manus Rudd

## 2014-02-21 ENCOUNTER — Encounter (HOSPITAL_COMMUNITY): Payer: Self-pay | Admitting: Internal Medicine

## 2014-02-25 ENCOUNTER — Other Ambulatory Visit: Payer: Self-pay | Admitting: Internal Medicine

## 2014-02-25 DIAGNOSIS — K922 Gastrointestinal hemorrhage, unspecified: Secondary | ICD-10-CM

## 2014-03-10 ENCOUNTER — Other Ambulatory Visit: Payer: Self-pay

## 2014-03-10 DIAGNOSIS — K922 Gastrointestinal hemorrhage, unspecified: Secondary | ICD-10-CM

## 2014-03-11 ENCOUNTER — Ambulatory Visit (INDEPENDENT_AMBULATORY_CARE_PROVIDER_SITE_OTHER): Payer: Medicare Other | Admitting: Orthopedic Surgery

## 2014-03-11 VITALS — BP 145/88 | Ht 65.0 in | Wt 181.8 lb

## 2014-03-11 DIAGNOSIS — Z9889 Other specified postprocedural states: Secondary | ICD-10-CM

## 2014-03-11 NOTE — Progress Notes (Signed)
Left carpal tunnel release 10/25/2013  Complains of scar tenderness occasional pain but she's gotten nighttime relief and relief of her numbness and tingling  Reports no weakness  Her incision is slightly tender but healed there are no signs of infection. She has regained full range of motion and normal grip strength  Follow-up as needed

## 2014-03-14 LAB — HEMOGLOBIN AND HEMATOCRIT, BLOOD
HCT: 38.7 % (ref 36.0–46.0)
HEMOGLOBIN: 12.7 g/dL (ref 12.0–15.0)

## 2014-03-24 ENCOUNTER — Ambulatory Visit: Payer: Medicare Other | Admitting: Nurse Practitioner

## 2014-04-10 ENCOUNTER — Other Ambulatory Visit: Payer: Self-pay

## 2014-04-10 ENCOUNTER — Encounter: Payer: Self-pay | Admitting: Nurse Practitioner

## 2014-04-10 ENCOUNTER — Ambulatory Visit (INDEPENDENT_AMBULATORY_CARE_PROVIDER_SITE_OTHER): Payer: Medicare Other | Admitting: Nurse Practitioner

## 2014-04-10 VITALS — BP 140/93 | HR 86 | Temp 97.5°F | Ht 65.0 in | Wt 188.4 lb

## 2014-04-10 DIAGNOSIS — K922 Gastrointestinal hemorrhage, unspecified: Secondary | ICD-10-CM | POA: Diagnosis not present

## 2014-04-10 DIAGNOSIS — K219 Gastro-esophageal reflux disease without esophagitis: Secondary | ICD-10-CM

## 2014-04-10 MED ORDER — PEG 3350-KCL-NA BICARB-NACL 420 G PO SOLR: 4000.0000 mL | Freq: Once | ORAL | Status: DC

## 2014-04-10 NOTE — Progress Notes (Signed)
cc'ed to pcp °

## 2014-04-10 NOTE — Assessment & Plan Note (Signed)
Patient presents for follow-up on GERD symptoms with recent EGD completed to 416 showing reflux esophagitis patulous GE junction and small hiatal hernia. Was switched from omeprazole to Dexilant  60 mg daily. States Dexilant  medication is improving her symptoms and air well controlled. Plan continue Dexilant as ordered, follow up as needed for worsening or return symptoms.

## 2014-04-10 NOTE — Assessment & Plan Note (Addendum)
65 year old female seen at last visit for rectal bleeding. H/H was normal at time of her colonoscopy, high normal at previous visit. Her colonoscopy was incomplete due to inadequate preparation, patient states she might have eaten something too late during the nothing by mouth timeframe which may have caused the inadequate preparation. Recommendation to offer repeat colonoscopy. Patient agrees to have an additional colonoscopy, this time will be in the OR with propofol moderate sedation. We will add an exudative clear liquids onto her prep regimen to help promote adequate preparation. Of note she has not had any recurrent GI bleeding but still wishes to proceed with the colonoscopy as she is concerned about her prior bleeding symptoms.  Proceed with TCS in the OR with proporol based on Dr. Roseanne Kaufman recommendations with Dr. Gala Romney in near future: the risks, benefits, and alternatives have been discussed with the patient in detail. The patient states understanding and desires to proceed.

## 2014-04-10 NOTE — Progress Notes (Signed)
Referring Provider: Alycia Rossetti, MD Primary Care Physician:  Vic Blackbird, MD Primary GI:  Dr. Gala Romney  Chief Complaint  Patient presents with  . Follow-up    HPI:   64 year old female presents for procedure follow-up. een here 02/03/14 for ED follow-up with rectal bleeding with normal H/H (13.0/38.3). Admitted dark stools at that time and toilet tissue hematochezia without known history of hemorrhoids, denied iron supplementation. Also admitted recurrent GERD despite PPI. Colonoscopy and endoscopy done 02/20/14 with EGD showing erosive reflux esophagitis, patulous GE junction, small hiatal hernia. Commended stop Prilosec, Dexilant 60 mg daily. Colonoscopy report showed scattered pancolonic diverticula, a good part of the rectal and colonic mucosa was unable to be visualized because of inadequate preparation recommended follow-up in one month and offer a reschedule colonoscopy under propofol in the OR with adequate preparation. H/H done at that time showed hemoglobin 12.7, hematocrit 3 8.7. This is an increase from 3 weeks prior when her hemoglobin was 11.8 and her hematocrit was 35.9.  Today she states the Dexilant is helping and her GERD symptomsa re now adequately controlled. States she may have eaten something too late which could have been the cause of the poor preparation for the colonoscopy. States she doesn't remember any part of the procedure. She is agreeable to repeat the procedure with propofol. Denies any further bleeding, abdominal pain, N/V/D. Denies any other upper or lower GI symptoms.   Past Medical History  Diagnosis Date  . Hypertension   . Reflux   . Diverticular disease   . Arthritis     generalized OA  . Carpal tunnel syndrome   . GERD (gastroesophageal reflux disease)   . Anxiety     Past Surgical History  Procedure Laterality Date  . Abdominal hysterectomy    . Tonsillectomy    . Cholecystectomy  unknown    "about 3-4 years ago" per patient  (2013-2014)  . Carpal tunnel release Left 10/25/2013    Procedure: CARPAL TUNNEL RELEASE;  Surgeon: Carole Civil, MD;  Location: [facility];  Service: Orthopedics;  Laterality: Left;  . Esophagogastroduodenoscopy N/A 02/20/2014    RMR: Erosive reflux esophagitis. Patulous EG junction. Small hiatal hernia.  . Colonoscopy N/A 02/20/2014    RMR: Pana colonic diverticulosis. Incomplete colonscopy because of inadequate preparation.     Current Outpatient Prescriptions  Medication Sig Dispense Refill  . amLODipine (NORVASC) 5 MG tablet Take 5 mg by mouth daily.    Marland Kitchen gabapentin (NEURONTIN) 300 MG capsule Take 300-600 mg by mouth 2 (two) times daily. 300 mg in the morning and 600 mg at bedtime.    . metoprolol tartrate (LOPRESSOR) 25 MG tablet Take 1 tablet (25 mg total) by mouth 2 (two) times daily. 60 tablet 3  . nabumetone (RELAFEN) 500 MG tablet Take 1 tablet (500 mg total) by mouth 2 (two) times daily. 60 tablet 5  . dexlansoprazole (DEXILANT) 60 MG capsule Take 60 mg by mouth daily.    . peg 3350 powder (MOVIPREP) 100 G SOLR Take 1 kit (200 g total) by mouth as directed. (Patient not taking: Reported on 04/10/2014) 1 kit 0   No current facility-administered medications for this visit.    Allergies as of 04/10/2014 - Review Complete 02/20/2014  Allergen Reaction Noted  . Ibuprofen  12/20/2007    Family History  Problem Relation Age of Onset  . Arthritis Mother   . Diabetes Father   . Hypertension Sister   . Depression Paternal Uncle   .  Colon cancer Maternal Grandmother     History   Social History  . Marital Status: Widowed    Spouse Name: N/A  . Number of Children: N/A  . Years of Education: N/A   Social History Main Topics  . Smoking status: Former Smoker -- 0.00 packs/day for 20 years    Types: Cigarettes, E-cigarettes    Start date: 09/20/1970    Quit date: 07/17/2013  . Smokeless tobacco: Former Systems developer    Quit date: 06/12/2013     Comment: trying to quit with electronic  cigarette  . Alcohol Use: No     Comment: (1) beer per day-states stopped 06/2013  . Drug Use: No  . Sexual Activity: Yes    Birth Control/ Protection: Surgical   Other Topics Concern  . None   Social History Narrative    Review of Systems: Gen: Denies fever, chills, anorexia. Denies fatigue, weakness, weight loss.  CV: Denies chest pain, palpitations, syncope, peripheral edema, and claudication. Resp: Denies dyspnea at rest, cough, wheezing, coughing up blood, and pleurisy. GI: Denies vomiting blood, jaundice, and fecal incontinence.   Denies dysphagia or odynophagia. Derm: Denies rash, itching, dry skin Psych: Denies depression, anxiety, memory loss, confusion. No homicidal or suicidal ideation.  Heme: Denies bruising, bleeding, and enlarged lymph nodes.  Physical Exam: BP 140/93 mmHg  Pulse 86  Temp(Src) 97.5 F (36.4 C) (Oral)  Ht _0  (1.651 m)  Wt 188 lb 6.4 oz (85.458 kg)  BMI 31.35 kg/m2 General:   Alert and oriented. No distress noted. Pleasant and cooperative.  Head:  Normocephalic and atraumatic. Eyes:  Conjuctiva clear without scleral icterus. Mouth:  Oral mucosa pink and moist. Good dentition. No lesions. Neck:  Supple, without mass or thyromegaly. Lungs:  Clear to auscultation bilaterally. No wheezes, rales, or rhonchi. No distress.  Heart:  S1, S2 present without murmurs, rubs, or gallops. Regular rate and rhythm. Abdomen:  +BS, soft, non-tender and non-distended. No rebound or guarding. No HSM or masses noted. Msk:  Symmetrical without gross deformities. Normal posture. Pulses:  2+ DP noted bilaterally Extremities:  Without edema. Neurologic:  Alert and  oriented x4;  grossly normal neurologically. Skin:  Intact without significant lesions or rashes. Cervical Nodes:  No significant cervical adenopathy. Psych:  Alert and cooperative. Normal mood and affect.    04/10/2014 2:17 PM

## 2014-04-10 NOTE — Patient Instructions (Signed)
1. We will schedule your repeat colonoscopy 2. Follow the prep schedule closely. 3. We will do the procedure in the OR with heavier sedation to ensure you are comfortable during the procedure. 4. Follow-up recommendations to be based on the results of you procedure.

## 2014-04-16 ENCOUNTER — Other Ambulatory Visit: Payer: Self-pay

## 2014-04-16 MED ORDER — PEG 3350-KCL-NA BICARB-NACL 420 G PO SOLR: 4000.0000 mL | Freq: Once | ORAL | Status: DC

## 2014-04-28 NOTE — Patient Instructions (Signed)
SYD MANGES  04/28/2014   Your procedure is scheduled on:  05/01/2014  Report to [facility] at  28  AM.  Call this number if you have problems the morning of surgery: [phone]   Remember:   Do not eat food or drink liquids after midnight.   Take these medicines the morning of surgery with A SIP OF WATER:  Amlodipine, dexilant, neurontin, metoprolol, relafen   Do not wear jewelry, make-up or nail polish.  Do not wear lotions, powders, or perfumes.   Do not shave 48 hours prior to surgery. Men may shave face and neck.  Do not bring valuables to the hospital.  [facility] is not responsible for any belongings or valuables.               Contacts, dentures or bridgework may not be worn into surgery.  Leave suitcase in the car. After surgery it may be brought to your room.  For patients admitted to the hospital, discharge time is determined by your treatment team.               Patients discharged the day of surgery will not be allowed to drive home.  Name and phone number of your driver: family  Special Instructions: N/A   Please read over the following fact sheets that you were given: Pain Booklet, Coughing and Deep Breathing, Surgical Site Infection Prevention, Anesthesia Post-op Instructions and Care and Recovery After Surgery Colonoscopy A colonoscopy is an exam to look at the entire large intestine (colon). This exam can help find problems such as tumors, polyps, inflammation, and areas of bleeding. The exam takes about 1 hour.  LET [facility] CARE PROVIDER KNOW ABOUT:   Any allergies you have.  All medicines you are taking, including vitamins, herbs, eye drops, creams, and over-the-counter medicines.  Previous problems you or members of your family have had with the use of anesthetics.  Any blood disorders you have.  Previous surgeries you have had.  Medical conditions you have. RISKS AND COMPLICATIONS  Generally, this is a safe procedure. However, as with  any procedure, complications can occur. Possible complications include:  Bleeding.  Tearing or rupture of the colon wall.  Reaction to medicines given during the exam.  Infection (rare). BEFORE THE PROCEDURE   Ask your health care provider about changing or stopping your regular medicines.  You may be prescribed an oral bowel prep. This involves drinking a large amount of medicated liquid, starting the day before your procedure. The liquid will cause you to have multiple loose stools until your stool is almost clear or light green. This cleans out your colon in preparation for the procedure.  Do not eat or drink anything else once you have started the bowel prep, unless your health care provider tells you it is safe to do so.  Arrange for someone to drive you home after the procedure. PROCEDURE   You will be given medicine to help you relax (sedative).  You will lie on your side with your knees bent.  A long, flexible tube with a light and camera on the end (colonoscope) will be inserted through the rectum and into the colon. The camera sends video back to a computer screen as it moves through the colon. The colonoscope also releases carbon dioxide gas to inflate the colon. This helps your health care provider see the area better.  During the exam, your health care provider may take a small  tissue sample (biopsy) to be examined under a microscope if any abnormalities are found.  The exam is finished when the entire colon has been viewed. AFTER THE PROCEDURE   Do not drive for 24 hours after the exam.  You may have a small amount of blood in your stool.  You may pass moderate amounts of gas and have mild abdominal cramping or bloating. This is caused by the gas used to inflate your colon during the exam.  Ask when your test results will be ready and how you will get your results. Make sure you get your test results. Document Released: 01/01/2000 Document Revised: 10/24/2012  Document Reviewed: 09/10/2012 [facility] Patient Information 2015 [place], [place]. This information is not intended to replace advice given to you by your health care provider. Make sure you discuss any questions you have with your health care provider. PATIENT INSTRUCTIONS POST-ANESTHESIA  IMMEDIATELY FOLLOWING SURGERY:  Do not drive or operate machinery for the first twenty four hours after surgery.  Do not make any important decisions for twenty four hours after surgery or while taking narcotic pain medications or sedatives.  If you develop intractable nausea and vomiting or a severe headache please notify your doctor immediately.  FOLLOW-UP:  Please make an appointment with your surgeon as instructed. You do not need to follow up with anesthesia unless specifically instructed to do so.  WOUND CARE INSTRUCTIONS (if applicable):  Keep a dry clean dressing on the anesthesia/puncture wound site if there is drainage.  Once the wound has quit draining you may leave it open to air.  Generally you should leave the bandage intact for twenty four hours unless there is drainage.  If the epidural site drains for more than 36-48 hours please call the anesthesia department.  QUESTIONS?:  Please feel free to call your physician or the hospital operator if you have any questions, and they will be happy to assist you.

## 2014-04-29 ENCOUNTER — Encounter (HOSPITAL_COMMUNITY): Payer: Self-pay

## 2014-04-29 ENCOUNTER — Encounter (HOSPITAL_COMMUNITY)
Admission: RE | Admit: 2014-04-29 | Discharge: 2014-04-29 | Disposition: A | Discharge status: Home / Self Care | Payer: Medicare Other | Source: Ambulatory Visit | Attending: Internal Medicine | Admitting: Internal Medicine

## 2014-04-29 DIAGNOSIS — K648 Other hemorrhoids: Secondary | ICD-10-CM | POA: Diagnosis not present

## 2014-04-29 DIAGNOSIS — K219 Gastro-esophageal reflux disease without esophagitis: Secondary | ICD-10-CM | POA: Diagnosis not present

## 2014-04-29 DIAGNOSIS — Z87891 Personal history of nicotine dependence: Secondary | ICD-10-CM | POA: Diagnosis not present

## 2014-04-29 DIAGNOSIS — Z9049 Acquired absence of other specified parts of digestive tract: Secondary | ICD-10-CM | POA: Diagnosis not present

## 2014-04-29 DIAGNOSIS — K625 Hemorrhage of anus and rectum: Secondary | ICD-10-CM | POA: Diagnosis present

## 2014-04-29 DIAGNOSIS — K5730 Diverticulosis of large intestine without perforation or abscess without bleeding: Secondary | ICD-10-CM | POA: Diagnosis not present

## 2014-04-29 DIAGNOSIS — F419 Anxiety disorder, unspecified: Secondary | ICD-10-CM | POA: Diagnosis not present

## 2014-04-29 DIAGNOSIS — I10 Essential (primary) hypertension: Secondary | ICD-10-CM | POA: Diagnosis not present

## 2014-04-29 DIAGNOSIS — M159 Polyosteoarthritis, unspecified: Secondary | ICD-10-CM | POA: Diagnosis not present

## 2014-04-29 DIAGNOSIS — Z79899 Other long term (current) drug therapy: Secondary | ICD-10-CM | POA: Diagnosis not present

## 2014-04-29 DIAGNOSIS — Z791 Long term (current) use of non-steroidal anti-inflammatories (NSAID): Secondary | ICD-10-CM | POA: Diagnosis not present

## 2014-04-29 LAB — BASIC METABOLIC PANEL
ANION GAP: 8 (ref 5–15)
BUN: 14 mg/dL (ref 6–23)
CO2: 25 mmol/L (ref 19–32)
Calcium: 9.1 mg/dL (ref 8.4–10.5)
Chloride: 106 mmol/L (ref 96–112)
Creatinine, Ser: 0.89 mg/dL (ref 0.50–1.10)
GFR calc non Af Amer: 69 mL/min — ABNORMAL LOW (ref >90)
GFR, EST AFRICAN AMERICAN: 79 mL/min — AB (ref >90)
GLUCOSE: 105 mg/dL — AB (ref 70–99)
POTASSIUM: 4.2 mmol/L (ref 3.5–5.1)
SODIUM: 139 mmol/L (ref 135–145)

## 2014-04-29 LAB — CBC
HEMATOCRIT: 40.2 % (ref 36.0–46.0)
HEMOGLOBIN: 13.5 g/dL (ref 12.0–15.0)
MCH: 32.5 pg (ref 26.0–34.0)
MCHC: 33.6 g/dL (ref 30.0–36.0)
MCV: 96.6 fL (ref 78.0–100.0)
Platelets: 418 10*3/uL — ABNORMAL HIGH (ref 150–400)
RBC: 4.16 MIL/uL (ref 3.87–5.11)
RDW: 13.1 % (ref 11.5–15.5)
WBC: 4.9 10*3/uL (ref 4.0–10.5)

## 2014-04-29 NOTE — Pre-Procedure Instructions (Signed)
Patient given information to sign up for my chart at home. 

## 2014-05-01 ENCOUNTER — Encounter (HOSPITAL_COMMUNITY): Payer: Self-pay | Admitting: *Deleted

## 2014-05-01 ENCOUNTER — Ambulatory Visit (HOSPITAL_COMMUNITY): Payer: Medicare Other | Admitting: Anesthesiology

## 2014-05-01 ENCOUNTER — Encounter (HOSPITAL_COMMUNITY)
Admission: RE | Disposition: A | Discharge status: Home / Self Care | Payer: Self-pay | Source: Ambulatory Visit | Attending: Internal Medicine

## 2014-05-01 ENCOUNTER — Ambulatory Visit (HOSPITAL_COMMUNITY)
Admission: RE | Admit: 2014-05-01 | Discharge: 2014-05-01 | Disposition: A | Discharge status: Home / Self Care | Payer: Medicare Other | Source: Ambulatory Visit | Attending: Internal Medicine | Admitting: Internal Medicine

## 2014-05-01 DIAGNOSIS — Z9049 Acquired absence of other specified parts of digestive tract: Secondary | ICD-10-CM | POA: Insufficient documentation

## 2014-05-01 DIAGNOSIS — Z791 Long term (current) use of non-steroidal anti-inflammatories (NSAID): Secondary | ICD-10-CM | POA: Insufficient documentation

## 2014-05-01 DIAGNOSIS — K5730 Diverticulosis of large intestine without perforation or abscess without bleeding: Secondary | ICD-10-CM | POA: Diagnosis not present

## 2014-05-01 DIAGNOSIS — Z1211 Encounter for screening for malignant neoplasm of colon: Secondary | ICD-10-CM

## 2014-05-01 DIAGNOSIS — K219 Gastro-esophageal reflux disease without esophagitis: Secondary | ICD-10-CM | POA: Insufficient documentation

## 2014-05-01 DIAGNOSIS — Z87891 Personal history of nicotine dependence: Secondary | ICD-10-CM | POA: Insufficient documentation

## 2014-05-01 DIAGNOSIS — Z79899 Other long term (current) drug therapy: Secondary | ICD-10-CM | POA: Insufficient documentation

## 2014-05-01 DIAGNOSIS — K5791 Diverticulosis of intestine, part unspecified, without perforation or abscess with bleeding: Secondary | ICD-10-CM

## 2014-05-01 DIAGNOSIS — I10 Essential (primary) hypertension: Secondary | ICD-10-CM | POA: Insufficient documentation

## 2014-05-01 DIAGNOSIS — K648 Other hemorrhoids: Secondary | ICD-10-CM | POA: Diagnosis not present

## 2014-05-01 DIAGNOSIS — M159 Polyosteoarthritis, unspecified: Secondary | ICD-10-CM | POA: Insufficient documentation

## 2014-05-01 DIAGNOSIS — F419 Anxiety disorder, unspecified: Secondary | ICD-10-CM | POA: Insufficient documentation

## 2014-05-01 HISTORY — PX: COLONOSCOPY WITH PROPOFOL: SHX5780

## 2014-05-01 SURGERY — COLONOSCOPY WITH PROPOFOL
Anesthesia: Monitor Anesthesia Care | Site: Anus

## 2014-05-01 MED ORDER — ONDANSETRON HCL 4 MG/2ML IJ SOLN
INTRAMUSCULAR | Status: AC
  Filled 2014-05-01: qty 2

## 2014-05-01 MED ORDER — MIDAZOLAM HCL 2 MG/2ML IJ SOLN
INTRAMUSCULAR | Status: AC
  Filled 2014-05-01: qty 2

## 2014-05-01 MED ORDER — FENTANYL CITRATE 0.05 MG/ML IJ SOLN
INTRAMUSCULAR | Status: DC | PRN
  Administered 2014-05-01 (×2): 25 ug via INTRAVENOUS

## 2014-05-01 MED ORDER — ONDANSETRON HCL 4 MG/2ML IJ SOLN: 4.0000 mg | Freq: Once | INTRAMUSCULAR | Status: DC | PRN

## 2014-05-01 MED ORDER — PROPOFOL INFUSION 10 MG/ML OPTIME
INTRAVENOUS | Status: DC | PRN
  Administered 2014-05-01: 150 ug/kg/min via INTRAVENOUS
  Administered 2014-05-01: 13:00:00 via INTRAVENOUS

## 2014-05-01 MED ORDER — LACTATED RINGERS IV SOLN
INTRAVENOUS | Status: DC
  Administered 2014-05-01 (×3): via INTRAVENOUS

## 2014-05-01 MED ORDER — ONDANSETRON HCL 4 MG/2ML IJ SOLN
4.0000 mg | Freq: Once | INTRAMUSCULAR | Status: AC
  Administered 2014-05-01: 4 mg via INTRAVENOUS

## 2014-05-01 MED ORDER — GLYCOPYRROLATE 0.2 MG/ML IJ SOLN
0.2000 mg | Freq: Once | INTRAMUSCULAR | Status: AC
  Administered 2014-05-01: 0.2 mg via INTRAVENOUS

## 2014-05-01 MED ORDER — LABETALOL HCL 5 MG/ML IV SOLN
INTRAVENOUS | Status: AC
  Filled 2014-05-01: qty 4

## 2014-05-01 MED ORDER — LIDOCAINE HCL (PF) 1 % IJ SOLN
INTRAMUSCULAR | Status: AC
  Filled 2014-05-01: qty 5

## 2014-05-01 MED ORDER — LIDOCAINE HCL (CARDIAC) 10 MG/ML IV SOLN
INTRAVENOUS | Status: DC | PRN
  Administered 2014-05-01: 25 mg via INTRAVENOUS

## 2014-05-01 MED ORDER — FENTANYL CITRATE 0.05 MG/ML IJ SOLN
25.0000 ug | INTRAMUSCULAR | Status: AC
  Administered 2014-05-01 (×2): 25 ug via INTRAVENOUS

## 2014-05-01 MED ORDER — FENTANYL CITRATE 0.05 MG/ML IJ SOLN: 25.0000 ug | INTRAMUSCULAR | Status: DC | PRN

## 2014-05-01 MED ORDER — MIDAZOLAM HCL 2 MG/2ML IJ SOLN
1.0000 mg | INTRAMUSCULAR | Status: AC | PRN
  Administered 2014-05-01: 2 mg via INTRAVENOUS
  Administered 2014-05-01 (×2): 1 mg via INTRAVENOUS

## 2014-05-01 MED ORDER — WATER FOR IRRIGATION, STERILE IR SOLN
Status: DC | PRN
  Administered 2014-05-01: 1000 mL via SURGICAL_CAVITY

## 2014-05-01 MED ORDER — FENTANYL CITRATE 0.05 MG/ML IJ SOLN
INTRAMUSCULAR | Status: AC
  Filled 2014-05-01: qty 2

## 2014-05-01 MED ORDER — GLYCOPYRROLATE 0.2 MG/ML IJ SOLN
INTRAMUSCULAR | Status: AC
  Filled 2014-05-01: qty 1

## 2014-05-01 MED ORDER — STERILE WATER FOR IRRIGATION IR SOLN
Status: DC | PRN
  Administered 2014-05-01: 1000 mL

## 2014-05-01 SURGICAL SUPPLY — 23 items
ELECT REM PT RETURN 9FT ADLT (ELECTROSURGICAL)
ELECTRODE REM PT RTRN 9FT ADLT (ELECTROSURGICAL) IMPLANT
FCP BXJMBJMB 240X2.8X (CUTTING FORCEPS)
FLOOR PAD 36X40 (MISCELLANEOUS) ×3
FORCEPS BIOP RAD 4 LRG CAP 4 (CUTTING FORCEPS) ×3 IMPLANT
FORCEPS BIOP RJ4 240 W/NDL (CUTTING FORCEPS)
FORCEPS BXJMBJMB 240X2.8X (CUTTING FORCEPS) IMPLANT
FORMALIN 10 PREFIL 20ML (MISCELLANEOUS) IMPLANT
INJECTOR/SNARE I SNARE (MISCELLANEOUS) IMPLANT
KIT CLEAN ENDO COMPLIANCE (KITS) ×3 IMPLANT
LUBRICANT JELLY 4.5OZ STERILE (MISCELLANEOUS) ×3 IMPLANT
MANIFOLD NEPTUNE II (INSTRUMENTS) ×3 IMPLANT
NEEDLE SCLEROTHERAPY 25GX240 (NEEDLE) IMPLANT
PAD FLOOR 36X40 (MISCELLANEOUS) ×1 IMPLANT
PROBE APC STR FIRE (PROBE) IMPLANT
PROBE INJECTION GOLD (MISCELLANEOUS)
PROBE INJECTION GOLD 7FR (MISCELLANEOUS) IMPLANT
SNARE ROTATE MED OVAL 20MM (MISCELLANEOUS) IMPLANT
SNARE SHORT THROW 13M SML OVAL (MISCELLANEOUS) IMPLANT
SYR 50ML LL SCALE MARK (SYRINGE) ×3 IMPLANT
TRAP SPECIMEN MUCOUS 40CC (MISCELLANEOUS) IMPLANT
TUBING IRRIGATION ENDOGATOR (MISCELLANEOUS) ×3 IMPLANT
WATER STERILE IRR 1000ML POUR (IV SOLUTION) ×3 IMPLANT

## 2014-05-01 NOTE — Anesthesia Preprocedure Evaluation (Signed)
Anesthesia Evaluation  Patient identified by MRN, date of birth, ID band Patient awake    Reviewed: Allergy & Precautions, H&P , NPO status , Patient's Chart, lab work & pertinent test results, reviewed documented beta blocker date and time   Airway Mallampati: III  TM Distance: >3 FB     Dental  (+) Edentulous Upper, Edentulous Lower   Pulmonary Current Smoker, former smoker,  breath sounds clear to auscultation        Cardiovascular hypertension, Pt. on home beta blockers and Pt. on medications + dysrhythmias (hx frequent PVC's) Rhythm:Regular Rate:Normal     Neuro/Psych PSYCHIATRIC DISORDERS Anxiety  Neuromuscular disease    GI/Hepatic GERD-  Medicated and Controlled,  Endo/Other    Renal/GU      Musculoskeletal   Abdominal   Peds  Hematology   Anesthesia Other Findings   Reproductive/Obstetrics                             Anesthesia Physical Anesthesia Plan  ASA: III  Anesthesia Plan: MAC   Post-op Pain Management:    Induction: Intravenous  Airway Management Planned: Simple Face Mask  Additional Equipment:   Intra-op Plan:   Post-operative Plan:   Informed Consent: I have reviewed the patients History and Physical, chart, labs and discussed the procedure including the risks, benefits and alternatives for the proposed anesthesia with the patient or authorized representative who has indicated his/her understanding and acceptance.     Plan Discussed with:   Anesthesia Plan Comments:         Anesthesia Quick Evaluation

## 2014-05-01 NOTE — Op Note (Addendum)
[facility] [address] [place], [address]   COLONOSCOPY PROCEDURE REPORT  PATIENT: Tammy Austin, Tammy Austin  MR#: [id number] BIRTHDATE: [birth year] , 63  yrs. old GENDER: female ENDOSCOPIST: R.  Garfield Cornea, MD FACP [facility] REFERRED DZ:[id number] Dale, M.D. PROCEDURE DATE:  05/23/2014 PROCEDURE:   Colonoscopy, diagnostic INDICATIONS:History GI bleed;: Inadequate colonoscopy(poor prep) earlier this year. MEDICATIONS: Deep sedation per Dr.  Pat[facility] ASA CLASS:       Class II  CONSENT: The risks, benefits, alternatives and imponderables including but not limited to bleeding, perforation as well as the possibility of a missed lesion have been reviewed.  The potential for biopsy, lesion removal, etc. have also been discussed. Questions have been answered.  All parties agreeable.  Please see the history and physical in the medical record for more information.  DESCRIPTION OF PROCEDURE:   After the risks benefits and alternatives of the procedure were thoroughly explained, informed consent was obtained.  The digital rectal exam revealed no abnormalities of the rectum.   The     endoscope was introduced through the anus and advanced to the cecum, which was identified by both the appendix and ileocecal valve. No adverse events experienced.   The quality of the prep was adequate  The instrument was then slowly withdrawn as the colon was fully examined.      COLON FINDINGS: Normal-appearing rectal mucosa side from hemorrhoids.  Pancolonic diverticulosis.  However, the remainder of the colonic mucosa appeared normal.  Retroflexion was performed. .   Withdrawal time=7 minutes 0 seconds.  The scope was withdrawn and the procedure completed. COMPLICATIONS: There were no immediate complications.  ENDOSCOPIC IMPRESSION: Internal hemorrhoids. Colonic diverticulosis. Recent hemoglobin normal.  RECOMMENDATIONS: Repeat colonoscopy in 10 years for screening  purposes. Office visit with [place] in 6 months to reassess GERD, etc.  eSigned:  R. Garfield Cornea, MD Rosalita Chessman Marval Regal 23-May-2014 1:08 PM Revised: May 23, 2014 1:08 PM  cc:  CPT CODES: ICD CODES:  The ICD and CPT codes recommended by this software are interpretations from the data that the clinical staff has captured with the software.  The verification of the translation of this report to the ICD and CPT codes and modifiers is the sole responsibility of the health care institution and practicing physician where this report was generated.  [employer] will not be held responsible for the validity of the ICD and CPT codes included on this report.  AMA assumes no liability for data contained or not contained herein. CPT is a Designer, television/film set of the [employer].

## 2014-05-01 NOTE — Transfer of Care (Signed)
Immediate Anesthesia Transfer of Care Note  Patient: Tammy Austin  Procedure(s) Performed: Procedure(s) with comments: COLONOSCOPY WITH PROPOFOL (N/A) - In cecum @ 12:30, out @ 12:37  Patient Location: PACU  Anesthesia Type:MAC  Level of Consciousness: awake, alert , oriented and patient cooperative  Airway & Oxygen Therapy: Patient Spontanous Breathing and Patient connected to face mask oxygen  Post-op Assessment: Report given to RN and Post -op Vital signs reviewed and stable  Post vital signs: Reviewed and stable  Last Vitals:  Filed Vitals:   05/01/14 1210  BP: 141/93  Pulse:   Temp:   Resp: 19    Complications: No apparent anesthesia complications

## 2014-05-01 NOTE — Interval H&P Note (Signed)
History and Physical Interval Note:  05/01/2014 11:59 AM  Tammy Austin  has presented today for surgery, with the diagnosis of rectal bleed, poor pre TCS, GERD  The various methods of treatment have been discussed with the patient and family. After consideration of risks, benefits and other options for treatment, the patient has consented to  Procedure(s) with comments: COLONOSCOPY WITH PROPOFOL (N/A) - 11:00am as a surgical intervention .  The patient's history has been reviewed, patient examined, no change in status, stable for surgery.  I have reviewed the patient's chart and labs.  Questions were answered to the patient's satisfaction.     [name]  No change.  Colonoscopy now being done to further evaluate hematochezia.The risks, benefits, limitations, alternatives and imponderables have been reviewed with the patient. Questions have been answered. All parties are agreeable.

## 2014-05-01 NOTE — H&P (View-Only) (Signed)
  Referring Provider: Carthage, Kawanta F, MD Primary Care Physician:  [place], KAWANTA, MD Primary GI:  Dr. Rourk  Chief Complaint  Patient presents with  . Follow-up    HPI:   64 year old female presents for procedure follow-up. een here 02/03/14 for ED follow-up with rectal bleeding with normal H/H (13.0/38.3). Admitted dark stools at that time and toilet tissue hematochezia without known history of hemorrhoids, denied iron supplementation. Also admitted recurrent GERD despite PPI. Colonoscopy and endoscopy done 02/20/14 with EGD showing erosive reflux esophagitis, patulous GE junction, small hiatal hernia. Commended stop Prilosec, Dexilant 60 mg daily. Colonoscopy report showed scattered pancolonic diverticula, a good part of the rectal and colonic mucosa was unable to be visualized because of inadequate preparation recommended follow-up in one month and offer a reschedule colonoscopy under propofol in the OR with adequate preparation. H/H done at that time showed hemoglobin 12.7, hematocrit 3 8.7. This is an increase from 3 weeks prior when her hemoglobin was 11.8 and her hematocrit was 35.9.  Today she states the Dexilant is helping and her GERD symptomsa re now adequately controlled. States she may have eaten something too late which could have been the cause of the poor preparation for the colonoscopy. States she doesn't remember any part of the procedure. She is agreeable to repeat the procedure with propofol. Denies any further bleeding, abdominal pain, N/V/D. Denies any other upper or lower GI symptoms.   Past Medical History  Diagnosis Date  . Hypertension   . Reflux   . Diverticular disease   . Arthritis     generalized OA  . Carpal tunnel syndrome   . GERD (gastroesophageal reflux disease)   . Anxiety     Past Surgical History  Procedure Laterality Date  . Abdominal hysterectomy    . Tonsillectomy    . Cholecystectomy  unknown    "about 3-4 years ago" per patient  (2013-2014)  . Carpal tunnel release Left 10/25/2013    Procedure: CARPAL TUNNEL RELEASE;  Surgeon: Stanley E Harrison, MD;  Location: [facility];  Service: Orthopedics;  Laterality: Left;  . Esophagogastroduodenoscopy N/A 02/20/2014    RMR: Erosive reflux esophagitis. Patulous EG junction. Small hiatal hernia.  . Colonoscopy N/A 02/20/2014    RMR: Pana colonic diverticulosis. Incomplete colonscopy because of inadequate preparation.     Current Outpatient Prescriptions  Medication Sig Dispense Refill  . amLODipine (NORVASC) 5 MG tablet Take 5 mg by mouth daily.    . gabapentin (NEURONTIN) 300 MG capsule Take 300-600 mg by mouth 2 (two) times daily. 300 mg in the morning and 600 mg at bedtime.    . metoprolol tartrate (LOPRESSOR) 25 MG tablet Take 1 tablet (25 mg total) by mouth 2 (two) times daily. 60 tablet 3  . nabumetone (RELAFEN) 500 MG tablet Take 1 tablet (500 mg total) by mouth 2 (two) times daily. 60 tablet 5  . dexlansoprazole (DEXILANT) 60 MG capsule Take 60 mg by mouth daily.    . peg 3350 powder (MOVIPREP) 100 G SOLR Take 1 kit (200 g total) by mouth as directed. (Patient not taking: Reported on 04/10/2014) 1 kit 0   No current facility-administered medications for this visit.    Allergies as of 04/10/2014 - Review Complete 02/20/2014  Allergen Reaction Noted  . Ibuprofen  12/20/2007    Family History  Problem Relation Age of Onset  . Arthritis Mother   . Diabetes Father   . Hypertension Sister   . Depression Paternal Uncle   .   Colon cancer Maternal Grandmother     History   Social History  . Marital Status: Widowed    Spouse Name: N/A  . Number of Children: N/A  . Years of Education: N/A   Social History Main Topics  . Smoking status: Former Smoker -- 0.00 packs/day for 20 years    Types: Cigarettes, E-cigarettes    Start date: 09/20/1970    Quit date: 07/17/2013  . Smokeless tobacco: Former User    Quit date: 06/12/2013     Comment: trying to quit with electronic  cigarette  . Alcohol Use: No     Comment: (1) beer per day-states stopped 06/2013  . Drug Use: No  . Sexual Activity: Yes    Birth Control/ Protection: Surgical   Other Topics Concern  . None   Social History Narrative    Review of Systems: Gen: Denies fever, chills, anorexia. Denies fatigue, weakness, weight loss.  CV: Denies chest pain, palpitations, syncope, peripheral edema, and claudication. Resp: Denies dyspnea at rest, cough, wheezing, coughing up blood, and pleurisy. GI: Denies vomiting blood, jaundice, and fecal incontinence.   Denies dysphagia or odynophagia. Derm: Denies rash, itching, dry skin Psych: Denies depression, anxiety, memory loss, confusion. No homicidal or suicidal ideation.  Heme: Denies bruising, bleeding, and enlarged lymph nodes.  Physical Exam: BP 140/93 mmHg  Pulse 86  Temp(Src) 97.5 F (36.4 C) (Oral)  Ht 5' 5" (1.651 m)  Wt 188 lb 6.4 oz (85.458 kg)  BMI 31.35 kg/m2 General:   Alert and oriented. No distress noted. Pleasant and cooperative.  Head:  Normocephalic and atraumatic. Eyes:  Conjuctiva clear without scleral icterus. Mouth:  Oral mucosa pink and moist. Good dentition. No lesions. Neck:  Supple, without mass or thyromegaly. Lungs:  Clear to auscultation bilaterally. No wheezes, rales, or rhonchi. No distress.  Heart:  S1, S2 present without murmurs, rubs, or gallops. Regular rate and rhythm. Abdomen:  +BS, soft, non-tender and non-distended. No rebound or guarding. No HSM or masses noted. Msk:  Symmetrical without gross deformities. Normal posture. Pulses:  2+ DP noted bilaterally Extremities:  Without edema. Neurologic:  Alert and  oriented x4;  grossly normal neurologically. Skin:  Intact without significant lesions or rashes. Cervical Nodes:  No significant cervical adenopathy. Psych:  Alert and cooperative. Normal mood and affect.    04/10/2014 2:17 PM  

## 2014-05-01 NOTE — Discharge Instructions (Signed)
Colonoscopy Discharge Instructions  Read the instructions outlined below and refer to this sheet in the next few weeks. These discharge instructions provide you with general information on caring for yourself after you leave the hospital. Your doctor may also give you specific instructions. While your treatment has been planned according to the most current medical practices available, unavoidable complications occasionally occur. If you have any problems or questions after discharge, call Dr. Gala Romney at [phone]. ACTIVITY  You may resume your regular activity, but move at a slower pace for the next 24 hours.   Take frequent rest periods for the next 24 hours.   Walking will help get rid of the air and reduce the bloated feeling in your belly (abdomen).   No driving for 24 hours (because of the medicine (anesthesia) used during the test).    Do not sign any important legal documents or operate any machinery for 24 hours (because of the anesthesia used during the test).  NUTRITION  Drink plenty of fluids.   You may resume your normal diet as instructed by your doctor.   Begin with a light meal and progress to your normal diet. Heavy or fried foods are harder to digest and may make you feel sick to your stomach (nauseated).   Avoid alcoholic beverages for 24 hours or as instructed.  MEDICATIONS  You may resume your normal medications unless your doctor tells you otherwise.  WHAT YOU CAN EXPECT TODAY  Some feelings of bloating in the abdomen.   Passage of more gas than usual.   Spotting of blood in your stool or on the toilet paper.  IF YOU HAD POLYPS REMOVED DURING THE COLONOSCOPY:  No aspirin products for 7 days or as instructed.   No alcohol for 7 days or as instructed.   Eat a soft diet for the next 24 hours.  FINDING OUT THE RESULTS OF YOUR TEST Not all test results are available during your visit. If your test results are not back during the visit, make an appointment  with your caregiver to find out the results. Do not assume everything is normal if you have not heard from your caregiver or the medical facility. It is important for you to follow up on all of your test results.  SEEK IMMEDIATE MEDICAL ATTENTION IF:  You have more than a spotting of blood in your stool.   Your belly is swollen (abdominal distention).   You are nauseated or vomiting.   You have a temperature over 101.   You have abdominal pain or discomfort that is severe or gets worse throughout the day.     Diverticulosis and hemorrhoid information provided  Repeat colonoscopy in 10 years for screening purposes  Office visit with [place] in 6 months   Diverticulosis Diverticulosis is the condition that develops when small pouches (diverticula) form in the wall of your colon. Your colon, or large intestine, is where water is absorbed and stool is formed. The pouches form when the inside layer of your colon pushes through weak spots in the outer layers of your colon. CAUSES  No one knows exactly what causes diverticulosis. RISK FACTORS  Being older than 78. Your risk for this condition increases with age. Diverticulosis is rare in people younger than 40 years. By age 40, almost everyone has it.  Eating a low-fiber diet.  Being frequently constipated.  Being overweight.  Not getting enough exercise.  Smoking.  Taking over-the-counter pain medicines, like aspirin and ibuprofen. SYMPTOMS  Most people  with diverticulosis do not have symptoms. DIAGNOSIS  Because diverticulosis often has no symptoms, health care providers often discover the condition during an exam for other colon problems. In many cases, a health care provider will diagnose diverticulosis while using a flexible scope to examine the colon (colonoscopy). TREATMENT  If you have never developed an infection related to diverticulosis, you may not need treatment. If you have had an infection before, treatment may  include:  Eating more fruits, vegetables, and grains.  Taking a fiber supplement.  Taking a live bacteria supplement (probiotic).  Taking medicine to relax your colon. HOME CARE INSTRUCTIONS   Drink at least 6-8 glasses of water each day to prevent constipation.  Try not to strain when you have a bowel movement.  Keep all follow-up appointments. If you have had an infection before:  Increase the fiber in your diet as directed by your health care provider or dietitian.  Take a dietary fiber supplement if your health care provider approves.  Only take medicines as directed by your health care provider. SEEK MEDICAL CARE IF:   You have abdominal pain.  You have bloating.  You have cramps.  You have not gone to the bathroom in 3 days. SEEK IMMEDIATE MEDICAL CARE IF:   Your pain gets worse.  Yourbloating becomes very bad.  You have a fever or chills, and your symptoms suddenly get worse.  You begin vomiting.  You have bowel movements that are bloody or black. MAKE SURE YOU:  Understand these instructions.  Will watch your condition.  Will get help right away if you are not doing well or get worse. Document Released: 10/01/2003 Document Revised: 01/08/2013 Document Reviewed: 11/28/2012 [facility] Patient Information 2015 [place], [place]. This information is not intended to replace advice given to you by your health care provider. Make sure you discuss any questions you have with your health care provider.   Hemorrhoids Hemorrhoids are swollen veins around the rectum or anus. There are two types of hemorrhoids:   Internal hemorrhoids. These occur in the veins just inside the rectum. They may poke through to the outside and become irritated and painful.  External hemorrhoids. These occur in the veins outside the anus and can be felt as a painful swelling or hard lump near the anus. CAUSES  Pregnancy.   Obesity.   Constipation or diarrhea.   Straining to  have a bowel movement.   Sitting for long periods on the toilet.  Heavy lifting or other activity that caused you to strain.  Anal intercourse. SYMPTOMS   Pain.   Anal itching or irritation.   Rectal bleeding.   Fecal leakage.   Anal swelling.   One or more lumps around the anus.  DIAGNOSIS  Your caregiver may be able to diagnose hemorrhoids by visual examination. Other examinations or tests that may be performed include:   Examination of the rectal area with a gloved hand (digital rectal exam).   Examination of anal canal using a small tube (scope).   A blood test if you have lost a significant amount of blood.  A test to look inside the colon (sigmoidoscopy or colonoscopy). TREATMENT Most hemorrhoids can be treated at home. However, if symptoms do not seem to be getting better or if you have a lot of rectal bleeding, your caregiver may perform a procedure to help make the hemorrhoids get smaller or remove them completely. Possible treatments include:   Placing a rubber band at the base of the hemorrhoid to cut  off the circulation (rubber band ligation).   Injecting a chemical to shrink the hemorrhoid (sclerotherapy).   Using a tool to burn the hemorrhoid (infrared light therapy).   Surgically removing the hemorrhoid (hemorrhoidectomy).   Stapling the hemorrhoid to block blood flow to the tissue (hemorrhoid stapling).  HOME CARE INSTRUCTIONS   Eat foods with fiber, such as whole grains, beans, nuts, fruits, and vegetables. Ask your doctor about taking products with added fiber in them (fibersupplements).  Increase fluid intake. Drink enough water and fluids to keep your urine clear or pale yellow.   Exercise regularly.   Go to the bathroom when you have the urge to have a bowel movement. Do not wait.   Avoid straining to have bowel movements.   Keep the anal area dry and clean. Use wet toilet paper or moist towelettes after a bowel movement.    Medicated creams and suppositories may be used or applied as directed.   Only take over-the-counter or prescription medicines as directed by your caregiver.   Take warm sitz baths for 15-20 minutes, 3-4 times a day to ease pain and discomfort.   Place ice packs on the hemorrhoids if they are tender and swollen. Using ice packs between sitz baths may be helpful.   Put ice in a plastic bag.   Place a towel between your skin and the bag.   Leave the ice on for 15-20 minutes, 3-4 times a day.   Do not use a donut-shaped pillow or sit on the toilet for long periods. This increases blood pooling and pain.  SEEK MEDICAL CARE IF:  You have increasing pain and swelling that is not controlled by treatment or medicine.  You have uncontrolled bleeding.  You have difficulty or you are unable to have a bowel movement.  You have pain or inflammation outside the area of the hemorrhoids. MAKE SURE YOU:  Understand these instructions.  Will watch your condition.  Will get help right away if you are not doing well or get worse. Document Released: 01/01/2000 Document Revised: 12/21/2011 Document Reviewed: 11/08/2011 [facility] Patient Information 2015 [place], [place]. This information is not intended to replace advice given to you by your health care provider. Make sure you discuss any questions you have with your health care provider.

## 2014-05-01 NOTE — Anesthesia Procedure Notes (Signed)
Procedure Name: MAC Date/Time: 05/01/2014 12:13 PM Performed by: Andree Elk, AMY A Pre-anesthesia Checklist: Patient identified, Timeout performed, Emergency Drugs available, Suction available and Patient being monitored Oxygen Delivery Method: Simple face mask

## 2014-05-01 NOTE — Anesthesia Postprocedure Evaluation (Signed)
  Anesthesia Post-op Note  Patient: Tammy Austin  Procedure(s) Performed: Procedure(s) with comments: COLONOSCOPY WITH PROPOFOL (N/A) - In cecum @ 12:30, out @ 12:37  Patient Location: PACU  Anesthesia Type:MAC  Level of Consciousness: awake, alert , oriented and patient cooperative  Airway and Oxygen Therapy: Patient Spontanous Breathing  Post-op Pain: none  Post-op Assessment: Post-op Vital signs reviewed, Patient's Cardiovascular Status Stable, Respiratory Function Stable, Patent Airway, No signs of Nausea or vomiting and Pain level controlled  Post-op Vital Signs: Reviewed and stable  Last Vitals:  Filed Vitals:   05/01/14 1247  BP: 121/81  Pulse: 79  Temp: 36.9 C  Resp: 22    Complications: No apparent anesthesia complications

## 2014-05-02 ENCOUNTER — Encounter (HOSPITAL_COMMUNITY): Payer: Self-pay | Admitting: Internal Medicine

## 2014-05-09 ENCOUNTER — Other Ambulatory Visit: Payer: Self-pay | Admitting: Family Medicine

## 2014-05-09 NOTE — Telephone Encounter (Signed)
Refill appropriate and filled per protocol. 

## 2014-05-13 ENCOUNTER — Encounter: Payer: Self-pay | Admitting: Family Medicine

## 2014-05-13 ENCOUNTER — Ambulatory Visit (INDEPENDENT_AMBULATORY_CARE_PROVIDER_SITE_OTHER): Payer: Medicare Other | Admitting: Family Medicine

## 2014-05-13 VITALS — BP 138/74 | HR 76 | Temp 97.8°F | Resp 16 | Ht 65.0 in | Wt 186.0 lb

## 2014-05-13 DIAGNOSIS — I10 Essential (primary) hypertension: Secondary | ICD-10-CM | POA: Diagnosis not present

## 2014-05-13 DIAGNOSIS — K219 Gastro-esophageal reflux disease without esophagitis: Secondary | ICD-10-CM | POA: Diagnosis not present

## 2014-05-13 MED ORDER — AMLODIPINE BESYLATE 5 MG PO TABS: 5.0000 mg | ORAL_TABLET | Freq: Every day | ORAL | Status: DC

## 2014-05-13 MED ORDER — DEXLANSOPRAZOLE 60 MG PO CPDR: 60.0000 mg | DELAYED_RELEASE_CAPSULE | Freq: Every day | ORAL | Status: DC

## 2014-05-13 MED ORDER — METOPROLOL TARTRATE 25 MG PO TABS: 25.0000 mg | ORAL_TABLET | Freq: Two times a day (BID) | ORAL | Status: DC

## 2014-05-13 NOTE — Patient Instructions (Signed)
Continue current medications F/U 4 months for PHYSICAL

## 2014-05-13 NOTE — Assessment & Plan Note (Signed)
Blood pressure medication will be resumed typically fairly well-controlled with this. We will do fasting labs at her follow-up visit

## 2014-05-13 NOTE — Progress Notes (Signed)
Patient ID: Tammy Austin, female   DOB: [birth year], 64 y.o.   MRN: [id number]   Subjective:    Patient ID: Tammy Austin, female    DOB: 05/03/1950, 64 y.o.   MRN: [id number]  Patient presents for 6 month F/U  patient to follow-up medications. She's no particular concerns. She is status post carpal tunnel release on her right hand which she did very well with. She is now taking her gabapentin as prescribed. She is also status post colonoscopy she did have internal hemorrhoids and diverticulosis but no active bleeding and her hemoglobin has been stable. She's been out of her blood pressure medicine for the past 3 days but otherwise feels well.  Mole removed from nose was benign  Review Of Systems:  GEN- denies fatigue, fever, weight loss,weakness, recent illness HEENT- denies eye drainage, change in vision, nasal discharge, CVS- denies chest pain, palpitations RESP- denies SOB, cough, wheeze ABD- denies N/V, change in stools, abd pain GU- denies dysuria, hematuria, dribbling, incontinence MSK- denies joint pain, muscle aches, injury Neuro- denies headache, dizziness, syncope, seizure activity       Objective:    BP 138/74 mmHg  Pulse 76  Temp(Src) 97.8 F (36.6 C) (Oral)  Resp 16  Ht 5\' 5"  (1.651 m)  Wt 186 lb (84.369 kg)  BMI 30.95 kg/m2 GEN- NAD, alert and oriented x3 HEENT- PERRL, EOMI, non injected sclera, pink conjunctiva, MMM, oropharynx clear CVS- RRR, no murmur RESP-CTAB EXT- No edema Pulses- Radial, DP- 2+        Assessment & Plan:      Problem List Items Addressed This Visit    Essential hypertension - Primary   Relevant Medications   amLODipine (NORVASC) 5 MG tablet      Note: This dictation was prepared with Dragon dictation along with smaller phrase technology. Any transcriptional errors that result from this process are unintentional.

## 2014-05-13 NOTE — Assessment & Plan Note (Signed)
Acid reflux is well controlled with the dexilant, reordered today

## 2014-05-27 ENCOUNTER — Other Ambulatory Visit: Payer: Self-pay | Admitting: Family Medicine

## 2014-05-27 NOTE — Telephone Encounter (Signed)
Refill appropriate and filled per protocol. 

## 2014-06-04 ENCOUNTER — Telehealth: Payer: Self-pay | Admitting: Family Medicine

## 2014-06-04 NOTE — Telephone Encounter (Signed)
noted 

## 2014-06-04 NOTE — Telephone Encounter (Signed)
Patient has not been seen for back pain in >1 year.   Appointment scheduled.

## 2014-06-04 NOTE — Telephone Encounter (Signed)
Pt has called and is saying that she is still having the back pain really bad When she sits down she states that it stopped hurting Pt is wanting a referral to therapist for her back  [phone]

## 2014-06-06 ENCOUNTER — Ambulatory Visit: Payer: Medicare Other | Admitting: Family Medicine

## 2014-06-10 ENCOUNTER — Ambulatory Visit: Payer: Medicare Other | Admitting: Family Medicine

## 2014-06-13 ENCOUNTER — Ambulatory Visit: Payer: Medicare Other | Admitting: Family Medicine

## 2014-06-18 ENCOUNTER — Encounter: Payer: Self-pay | Admitting: Family Medicine

## 2014-06-18 ENCOUNTER — Ambulatory Visit (INDEPENDENT_AMBULATORY_CARE_PROVIDER_SITE_OTHER): Payer: Medicare Other | Admitting: Family Medicine

## 2014-06-18 VITALS — BP 136/72 | HR 76 | Temp 97.6°F | Resp 14 | Ht 65.0 in | Wt 187.0 lb

## 2014-06-18 DIAGNOSIS — M5410 Radiculopathy, site unspecified: Secondary | ICD-10-CM | POA: Diagnosis not present

## 2014-06-18 DIAGNOSIS — B958 Unspecified staphylococcus as the cause of diseases classified elsewhere: Secondary | ICD-10-CM | POA: Diagnosis not present

## 2014-06-18 DIAGNOSIS — M5136 Other intervertebral disc degeneration, lumbar region: Secondary | ICD-10-CM

## 2014-06-18 DIAGNOSIS — L089 Local infection of the skin and subcutaneous tissue, unspecified: Secondary | ICD-10-CM

## 2014-06-18 MED ORDER — TRAMADOL HCL 50 MG PO TABS: ORAL_TABLET | ORAL | Status: DC

## 2014-06-18 MED ORDER — SULFAMETHOXAZOLE-TRIMETHOPRIM 800-160 MG PO TABS: 1.0000 | ORAL_TABLET | Freq: Two times a day (BID) | ORAL | Status: DC

## 2014-06-18 NOTE — Patient Instructions (Signed)
MRI of spine to be done Take antibiotics Clean with anti-bacterial soap Tramadol for pain F/U as previous

## 2014-06-19 NOTE — Assessment & Plan Note (Signed)
Appers to be staph infections that popping up in various places, will place on course of bactrim, anti-bacterial soap, no areas to open up today

## 2014-06-19 NOTE — Assessment & Plan Note (Signed)
Known DDD, with worsening syptoms, radiculopathy, needs MRI of spine Advised not to go to chiropracter until imaging done, so we can evaluate for any spinal stenosis

## 2014-06-19 NOTE — Progress Notes (Signed)
Patient ID: Tammy Austin, female   DOB: 11/20/1950, 64 y.o.   MRN: [id number]   Subjective:    Patient ID: Tammy Austin, female    DOB: August 28, 1950, 64 y.o.   MRN: [id number]  Patient presents for Back Pain and Open Areas- Buttocks    Worsening back pain over past few months, has known DDD, mild disc bulge, seen by PMR 2 years ago, had series of epidural injections, has used pain meds on and oFF and NSAIDS, now unable to stand to do dishes/laundry or clean without significant pain, has sharp pains that go to left groin and down thigh.  - no change in bowel or bladder   Past month has noted bumps that pop up and drain, then crust over, had a big one on her right buttocks, that has dried out now has one coming on her lower back. No fever, no insect bites that she is aware of.    Review Of Systems:  GEN- denies fatigue, fever, weight loss,weakness, recent illness HEENT- denies eye drainage, change in vision, nasal discharge, CVS- denies chest pain, palpitations RESP- denies SOB, cough, wheeze ABD- denies N/V, change in stools, abd pain GU- denies dysuria, hematuria, dribbling, incontinence MSK- + joint pain, +muscle aches, injury Neuro- denies headache, dizziness, syncope, seizure activity       Objective:    BP 136/72 mmHg  Pulse 76  Temp(Src) 97.6 F (36.4 C) (Oral)  Resp 14  Ht 5\' 5"  (1.651 m)  Wt 187 lb (84.823 kg)  BMI 31.12 kg/m2 GEN- NAD, alert and oriented x3 HEENT- PERRL, EOMI, non injected sclera, pink conjunctiva, MMM, oropharynx clear Skin- right butocks 2cm circular area with scab, NT, dry skin around it, right lower back small boil , with flaking skin around, mild erythem, mild induration, no fluctuance MSK- TTP Lumbar spine, equivical SLR left side, neg SLR right side, decreaed ROM Neuro- strength- equal bilat, sensation grossly in tact, normal tone EXT- No edema Pulses- Radial, DP- 2+        Assessment & Plan:      Problem List Items Addressed This Visit     Staphylococcal infection of skin    Appers to be staph infections that popping up in various places, will place on course of bactrim, anti-bacterial soap, no areas to open up today      Relevant Medications   sulfamethoxazole-trimethoprim (BACTRIM DS,SEPTRA DS) 800-160 MG per tablet   DDD (degenerative disc disease), lumbar - Primary    Known DDD, with worsening syptoms, radiculopathy, needs MRI of spine Advised not to go to chiropracter until imaging done, so we can evaluate for any spinal stenosis  - ultram given     Relevant Medications   traMADol (ULTRAM) 50 MG tablet   Other Relevant Orders   MR Lumbar Spine Wo Contrast    Other Visit Diagnoses    Back pain with left-sided radiculopathy        Relevant Medications    traMADol (ULTRAM) 50 MG tablet    Other Relevant Orders    MR Lumbar Spine Wo Contrast       Note: This dictation was prepared with Dragon dictation along with smaller phrase technology. Any transcriptional errors that result from this process are unintentional.

## 2014-07-10 ENCOUNTER — Ambulatory Visit (HOSPITAL_COMMUNITY)
Admission: RE | Admit: 2014-07-10 | Discharge: 2014-07-10 | Disposition: A | Discharge status: Home / Self Care | Payer: Medicare Other | Source: Ambulatory Visit | Attending: Family Medicine | Admitting: Family Medicine

## 2014-07-10 DIAGNOSIS — M5410 Radiculopathy, site unspecified: Secondary | ICD-10-CM | POA: Insufficient documentation

## 2014-07-10 DIAGNOSIS — M4316 Spondylolisthesis, lumbar region: Secondary | ICD-10-CM | POA: Diagnosis not present

## 2014-07-10 DIAGNOSIS — M5127 Other intervertebral disc displacement, lumbosacral region: Secondary | ICD-10-CM | POA: Diagnosis not present

## 2014-07-10 DIAGNOSIS — M9973 Connective tissue and disc stenosis of intervertebral foramina of lumbar region: Secondary | ICD-10-CM | POA: Diagnosis not present

## 2014-07-10 DIAGNOSIS — M47817 Spondylosis without myelopathy or radiculopathy, lumbosacral region: Secondary | ICD-10-CM | POA: Diagnosis not present

## 2014-07-10 DIAGNOSIS — M5136 Other intervertebral disc degeneration, lumbar region: Secondary | ICD-10-CM | POA: Insufficient documentation

## 2014-07-14 ENCOUNTER — Other Ambulatory Visit: Payer: Self-pay | Admitting: *Deleted

## 2014-07-14 DIAGNOSIS — M5416 Radiculopathy, lumbar region: Secondary | ICD-10-CM

## 2014-07-14 DIAGNOSIS — M47819 Spondylosis without myelopathy or radiculopathy, site unspecified: Secondary | ICD-10-CM

## 2014-07-14 DIAGNOSIS — M5136 Other intervertebral disc degeneration, lumbar region: Secondary | ICD-10-CM

## 2014-08-26 ENCOUNTER — Other Ambulatory Visit: Payer: Self-pay | Admitting: Orthopedic Surgery

## 2014-09-04 DIAGNOSIS — M79652 Pain in left thigh: Secondary | ICD-10-CM | POA: Diagnosis not present

## 2014-09-04 DIAGNOSIS — M47817 Spondylosis without myelopathy or radiculopathy, lumbosacral region: Secondary | ICD-10-CM | POA: Diagnosis not present

## 2014-09-04 DIAGNOSIS — G894 Chronic pain syndrome: Secondary | ICD-10-CM | POA: Diagnosis not present

## 2014-09-04 DIAGNOSIS — M545 Low back pain: Secondary | ICD-10-CM | POA: Diagnosis not present

## 2014-09-04 DIAGNOSIS — F1920 Other psychoactive substance dependence, uncomplicated: Secondary | ICD-10-CM | POA: Diagnosis not present

## 2014-09-16 ENCOUNTER — Encounter: Payer: Medicare Other | Admitting: Family Medicine

## 2014-09-30 ENCOUNTER — Telehealth: Payer: Self-pay | Admitting: *Deleted

## 2014-09-30 ENCOUNTER — Other Ambulatory Visit: Payer: Self-pay | Admitting: Family Medicine

## 2014-09-30 NOTE — Telephone Encounter (Signed)
Received PCS forms from [place].   MD reviewed forms and states that patient does not qualify for services.   Faxed back to agency.

## 2014-10-01 NOTE — Telephone Encounter (Signed)
Refill appropriate and filled per protocol. 

## 2014-10-14 ENCOUNTER — Other Ambulatory Visit: Payer: Self-pay | Admitting: Family Medicine

## 2014-10-14 NOTE — Telephone Encounter (Signed)
Ok to refill??  Last office visit/ refill 06/18/2014, #2 refills.

## 2014-10-14 NOTE — Telephone Encounter (Signed)
Okay to refill? 

## 2014-10-14 NOTE — Telephone Encounter (Signed)
Medication called to pharmacy. 

## 2014-10-29 ENCOUNTER — Ambulatory Visit: Payer: Medicare Other | Admitting: Gastroenterology

## 2014-11-07 ENCOUNTER — Telehealth: Payer: Self-pay | Admitting: *Deleted

## 2014-11-07 ENCOUNTER — Ambulatory Visit (INDEPENDENT_AMBULATORY_CARE_PROVIDER_SITE_OTHER): Payer: Medicare Other | Admitting: Family Medicine

## 2014-11-07 VITALS — BP 140/62 | HR 78 | Temp 98.3°F | Resp 14 | Ht 65.0 in | Wt 182.0 lb

## 2014-11-07 DIAGNOSIS — K5900 Constipation, unspecified: Secondary | ICD-10-CM | POA: Insufficient documentation

## 2014-11-07 DIAGNOSIS — K5901 Slow transit constipation: Secondary | ICD-10-CM

## 2014-11-07 DIAGNOSIS — M1710 Unilateral primary osteoarthritis, unspecified knee: Secondary | ICD-10-CM | POA: Insufficient documentation

## 2014-11-07 DIAGNOSIS — M1712 Unilateral primary osteoarthritis, left knee: Secondary | ICD-10-CM | POA: Diagnosis not present

## 2014-11-07 DIAGNOSIS — M5136 Other intervertebral disc degeneration, lumbar region: Secondary | ICD-10-CM

## 2014-11-07 DIAGNOSIS — M179 Osteoarthritis of knee, unspecified: Secondary | ICD-10-CM | POA: Insufficient documentation

## 2014-11-07 DIAGNOSIS — M159 Polyosteoarthritis, unspecified: Secondary | ICD-10-CM | POA: Diagnosis not present

## 2014-11-07 MED ORDER — METOPROLOL TARTRATE 25 MG PO TABS: 25.0000 mg | ORAL_TABLET | Freq: Two times a day (BID) | ORAL | Status: DC

## 2014-11-07 MED ORDER — DEXLANSOPRAZOLE 60 MG PO CPDR: 60.0000 mg | DELAYED_RELEASE_CAPSULE | Freq: Every day | ORAL | Status: DC

## 2014-11-07 MED ORDER — GABAPENTIN 300 MG PO CAPS: ORAL_CAPSULE | ORAL | Status: DC

## 2014-11-07 MED ORDER — TRAMADOL HCL 50 MG PO TABS: 100.0000 mg | ORAL_TABLET | Freq: Two times a day (BID) | ORAL | Status: DC | PRN

## 2014-11-07 MED ORDER — LACTULOSE 10 GM/15ML PO SOLN: 30.0000 g | Freq: Every day | ORAL | Status: DC | PRN

## 2014-11-07 MED ORDER — DICLOFENAC SODIUM 1.5 % TD SOLN: TRANSDERMAL | Status: DC

## 2014-11-07 MED ORDER — AMLODIPINE BESYLATE 5 MG PO TABS: 5.0000 mg | ORAL_TABLET | Freq: Every day | ORAL | Status: DC

## 2014-11-07 NOTE — Assessment & Plan Note (Addendum)
Trial of lactulose, not to use daily

## 2014-11-07 NOTE — Assessment & Plan Note (Addendum)
OA of left knee, noted on imaging, this along with back contributing to hip pain Given steroid injection, script for PENNSAID, has tried Naproxen, Nabumetone, steroids, allergy ibuprofen Will arrange for f/u with her spine specialist- may consider repeat epidural injections Will use ultram with tylenol

## 2014-11-07 NOTE — Patient Instructions (Signed)
Appt to be scheduled Dr. Ace Gins Lactulose as needed for constipation  Take 2 tramadol with Tylenol for pain  Ice your knee and elevate today  F/U 3 months

## 2014-11-07 NOTE — Telephone Encounter (Signed)
Received request from pharmacy for PA on Pensaid.   PA submitted.   Dx: OA L Knee (M17.12)

## 2014-11-07 NOTE — Progress Notes (Signed)
Patient ID: Tammy Austin, female   DOB: 06-Jul-1950, 64 y.o.   MRN: [id number]   Subjective:    Patient ID: Tammy Austin, female    DOB: 09-Aug-1950, 64 y.o.   MRN: [id number]  Patient presents for L Sided Pain  Pt here with left knee, pain, has known DDD, facet arthropathy bulgind disc affecting lefts side, was seen by Dr Ace Gins. SHe has had hip pain and left knee pain which she has known OA in as well. She was given Silenor which helped some but was not covered by her insurance, she has been taking Ultram 2 tabs along with 1/2 tab of hydrocodone which helps some. Also using Pennsaid topical which she requested a script for.  Constipation- has taken OTC meds, with minimal improvement,has been on lactulose in the past and this works well and quick for her    Review Of Systems:  GEN- denies fatigue, fever, weight loss,weakness, recent illness HEENT- denies eye drainage, change in vision, nasal discharge, CVS- denies chest pain, palpitations RESP- denies SOB, cough, wheeze ABD- denies N/V,+ change in stools, abd pain GU- denies dysuria, hematuria, dribbling, incontinence MSK- + joint pain, muscle aches, injury Neuro- denies headache, dizziness, syncope, seizure activity       Objective:    BP 140/62 mmHg  Pulse 78  Temp(Src) 98.3 F (36.8 C) (Oral)  Resp 14  Ht 5\' 5"  (1.651 m)  Wt 182 lb (82.555 kg)  BMI 30.29 kg/m2 GEN- NAD, alert and oriented x3 ABD-NABS,soft,NT,ND MSK- Bilat knee no effusion, fair ROM, +crepitus, Bilat hip fair ROM, NT to palpation, antalgic gait  EXT- No edema Pulses- Radial, DP- 2+   Procedure- Knee injection Procedure explained to patient questions answered benefits and risks discussed verbal consent obtained. Antiseptic-Betadine Injection- Kenalog 40mg  1cc, Lidocaine 1% 2cc, Marcaine 0.5% 2cc Minimal blood loss Patient tolerated procedure well Bandage applied        Assessment & Plan:      Problem List Items Addressed This Visit    OA  (osteoarthritis) of knee    OA of left knee, noted on imaging, this along with back contributing to hip pain Given steroid injection, script for PENNSAID, has tried Naproxen, Nabumetone, steroids, allergy ibuprofen Will arrange for f/u with her spine specialist- may consider repeat epidural injections Will use ultram with tylenol      Relevant Medications   traMADol (ULTRAM) 50 MG tablet   Generalized OA   Relevant Medications   traMADol (ULTRAM) 50 MG tablet   DDD (degenerative disc disease), lumbar - Primary   Relevant Medications   traMADol (ULTRAM) 50 MG tablet   Constipation    Trial of lactulose, not to use daily         Note: This dictation was prepared with Dragon dictation along with smaller phrase technology. Any transcriptional errors that result from this process are unintentional.

## 2014-11-10 ENCOUNTER — Encounter: Payer: Self-pay | Admitting: *Deleted

## 2014-11-10 NOTE — Telephone Encounter (Signed)
Appeal faxed

## 2014-11-10 NOTE — Telephone Encounter (Signed)
Received PA determination.   PA denied.   Patient must try and fail (1) of the following covered medications: Difunisal Etodolac IR/ER Flurbiprofen Ketoprofen Meloxicam Oxprozin Piroxicam Sulindac  MD to be made aware.

## 2014-11-10 NOTE — Telephone Encounter (Signed)
She has been on Naprosyn, Relafen, Meloxicam

## 2014-11-12 ENCOUNTER — Telehealth: Payer: Self-pay | Admitting: *Deleted

## 2014-11-12 NOTE — Telephone Encounter (Signed)
Called and scheduled pt with Dr. Ace Gins on Wednesday Nov 3 at 2:20pm, pt is aware of appt

## 2014-11-19 ENCOUNTER — Ambulatory Visit (INDEPENDENT_AMBULATORY_CARE_PROVIDER_SITE_OTHER): Payer: Medicare Other | Admitting: Gastroenterology

## 2014-11-19 ENCOUNTER — Encounter: Payer: Self-pay | Admitting: Gastroenterology

## 2014-11-19 VITALS — BP 142/92 | HR 64 | Temp 98.4°F | Ht 65.0 in | Wt 180.0 lb

## 2014-11-19 DIAGNOSIS — K219 Gastro-esophageal reflux disease without esophagitis: Secondary | ICD-10-CM | POA: Diagnosis not present

## 2014-11-19 DIAGNOSIS — K5900 Constipation, unspecified: Secondary | ICD-10-CM | POA: Diagnosis not present

## 2014-11-19 MED ORDER — DEXLANSOPRAZOLE 60 MG PO CPDR: 60.0000 mg | DELAYED_RELEASE_CAPSULE | Freq: Every day | ORAL | Status: DC

## 2014-11-19 NOTE — Progress Notes (Signed)
cc'ed to pcp °

## 2014-11-19 NOTE — Progress Notes (Signed)
Primary Care Physician: Vic Blackbird, MD  Primary Gastroenterologist:  Garfield Cornea, MD   Chief Complaint  Patient presents with  . Follow-up    HPI: Tammy Austin is a 64 y.o. female here for follow-up. She was seen in March 2016 for GI bleeding, brbpr and dark stools. Also with GERD. Colonoscopy and endoscopy in February showed erosive reflux esophagitis, patulous GE junction, small hiatal hernia. Pancolonic diverticula, inadequate bowel preparation precluded complete exam. Colonoscopy on 05/01/2014 showed pancolonic diverticulosis, remainder of colon appeared normal. She had internal hemorrhoids. Repeat colonoscopy planned for 10 years.  Doing well on Dexilant. Occasionally takes over-the-counter antacids for breakthrough heartburn. Manages constipation with stool softener and lactulose. Works well for her. Denies any blood in the stool or melena. As long she keeps her bowels moving regularly she denies any abdominal pain or nausea. She would love to lose weight. She is very inactive. Eating 1 meal daily only. Discussed better dietary measures and ways to implement appropriate exercise into her lifestyle.    Current Outpatient Prescriptions  Medication Sig Dispense Refill  . amLODipine (NORVASC) 5 MG tablet Take 1 tablet (5 mg total) by mouth daily. 30 tablet 6  . dexlansoprazole (DEXILANT) 60 MG capsule Take 1 capsule (60 mg total) by mouth daily. 30 capsule 6  . Diclofenac Sodium 1.5 % SOLN Apply to affected joints three time a day as needed 1 Bottle 3  . gabapentin (NEURONTIN) 300 MG capsule TAKE ONE CAPSULE BY MOUTH EVERY MORNING AND TAKE TWO (2) CAPSULES AT BEDTIME. 90 capsule 3  . lactulose (CHRONULAC) 10 GM/15ML solution Take 45 mLs (30 g total) by mouth daily as needed for mild constipation. 240 mL 1  . metoprolol tartrate (LOPRESSOR) 25 MG tablet Take 1 tablet (25 mg total) by mouth 2 (two) times daily. 60 tablet 6  . multivitamin-iron-minerals-folic acid (CENTRUM)  chewable tablet Chew 1 tablet by mouth daily.    . traMADol (ULTRAM) 50 MG tablet Take 2 tablets (100 mg total) by mouth every 12 (twelve) hours as needed. 120 tablet 2   No current facility-administered medications for this visit.    Allergies as of 11/19/2014 - Review Complete 11/19/2014  Allergen Reaction Noted  . Ibuprofen  12/20/2007    ROS:  General: Negative for anorexia, weight loss, fever, chills, fatigue, weakness. ENT: Negative for hoarseness, difficulty swallowing , nasal congestion. CV: Negative for chest pain, angina, palpitations, dyspnea on exertion, peripheral edema.  Respiratory: Negative for dyspnea at rest, dyspnea on exertion, cough, sputum, wheezing.  GI: See history of present illness. GU:  Negative for dysuria, hematuria, urinary incontinence, urinary frequency, nocturnal urination.  Endo: Negative for unusual weight change.    Physical Examination:   BP 142/92 mmHg  Pulse 64  Temp(Src) 98.4 F (36.9 C) (Oral)  Ht 5\' 5"  (1.651 m)  Wt 180 lb (81.647 kg)  BMI 29.95 kg/m2  General: Well-nourished, well-developed in no acute distress.  Eyes: No icterus. Mouth: Oropharyngeal mucosa moist and pink , no lesions erythema or exudate. Lungs: Clear to auscultation bilaterally.  Heart: Regular rate and rhythm, no murmurs rubs or gallops.  Abdomen: Bowel sounds are normal, nontender, nondistended, no hepatosplenomegaly or masses, no abdominal bruits or hernia , no rebound or guarding.   Extremities: No lower extremity edema. No clubbing or deformities. Neuro: Alert and oriented x 4   Skin: Warm and dry, no jaundice.   Psych: Alert and cooperative, normal mood and affect.  Labs:  Lab Results  Component Value Date   WBC 4.9 04/29/2014   HGB 13.5 04/29/2014   HCT 40.2 04/29/2014   MCV 96.6 04/29/2014   PLT 418* 04/29/2014    Imaging Studies: No results found.

## 2014-11-19 NOTE — Assessment & Plan Note (Signed)
Recently started lactulose. Works well for her. Continue. She will call if any other problems.

## 2014-11-19 NOTE — Patient Instructions (Signed)
1. Continue Dexilant 60mg  daily before breakfast. 2. Continue current regimen for constipation.  3. Return to the office in one year or sooner if needed.

## 2014-11-19 NOTE — Assessment & Plan Note (Signed)
Continue Dexilant 60 mg daily.Prescription provided. Return to the office in one year or call sooner if needed.

## 2014-11-20 DIAGNOSIS — M79652 Pain in left thigh: Secondary | ICD-10-CM | POA: Diagnosis not present

## 2014-11-20 DIAGNOSIS — M545 Low back pain: Secondary | ICD-10-CM | POA: Diagnosis not present

## 2014-11-20 DIAGNOSIS — M47817 Spondylosis without myelopathy or radiculopathy, lumbosacral region: Secondary | ICD-10-CM | POA: Diagnosis not present

## 2014-11-20 DIAGNOSIS — G894 Chronic pain syndrome: Secondary | ICD-10-CM | POA: Diagnosis not present

## 2014-11-20 NOTE — Telephone Encounter (Signed)
Received determination.   Appeal approved 11/07/2014- 01/17/2016.  [id number].  Pharmacy aware.

## 2015-01-07 ENCOUNTER — Other Ambulatory Visit: Payer: Self-pay | Admitting: Family Medicine

## 2015-01-07 NOTE — Telephone Encounter (Signed)
Refill appropriate and filled per protocol. 

## 2015-01-30 ENCOUNTER — Other Ambulatory Visit: Payer: Self-pay | Admitting: Family Medicine

## 2015-01-30 DIAGNOSIS — Z1231 Encounter for screening mammogram for malignant neoplasm of breast: Secondary | ICD-10-CM

## 2015-02-06 ENCOUNTER — Ambulatory Visit (HOSPITAL_COMMUNITY)
Admission: RE | Admit: 2015-02-06 | Discharge: 2015-02-06 | Disposition: A | Discharge status: Home / Self Care | Payer: Medicare Other | Source: Ambulatory Visit | Attending: Family Medicine | Admitting: Family Medicine

## 2015-02-06 DIAGNOSIS — Z1231 Encounter for screening mammogram for malignant neoplasm of breast: Secondary | ICD-10-CM | POA: Diagnosis not present

## 2015-02-06 LAB — HM MAMMOGRAPHY

## 2015-02-10 ENCOUNTER — Ambulatory Visit (INDEPENDENT_AMBULATORY_CARE_PROVIDER_SITE_OTHER): Payer: Medicare Other | Admitting: Family Medicine

## 2015-02-10 ENCOUNTER — Encounter: Payer: Self-pay | Admitting: Family Medicine

## 2015-02-10 VITALS — BP 140/78 | HR 88 | Temp 98.1°F | Resp 16 | Ht 65.0 in | Wt 181.0 lb

## 2015-02-10 DIAGNOSIS — R0789 Other chest pain: Secondary | ICD-10-CM | POA: Diagnosis not present

## 2015-02-10 DIAGNOSIS — E785 Hyperlipidemia, unspecified: Secondary | ICD-10-CM | POA: Diagnosis not present

## 2015-02-10 DIAGNOSIS — I10 Essential (primary) hypertension: Secondary | ICD-10-CM

## 2015-02-10 DIAGNOSIS — K219 Gastro-esophageal reflux disease without esophagitis: Secondary | ICD-10-CM | POA: Diagnosis not present

## 2015-02-10 DIAGNOSIS — M5136 Other intervertebral disc degeneration, lumbar region: Secondary | ICD-10-CM

## 2015-02-10 NOTE — Patient Instructions (Signed)
Continue current medications Take your night time blood pressure medication  Get your labs done fasting in [place] FLu shot given  F/U 4 months

## 2015-02-10 NOTE — Progress Notes (Signed)
Patient ID: Tammy Austin, female   DOB: July 27, 1950, 65 y.o.   MRN: [id number]    Subjective:    Patient ID: [name], female    DOB: [birth year], 65 y.o.   MRN: [id number]  Patient presents for 3 month F/U  she here for follow-up. She states that last night she had a little chest discomfort on the left side of her chest was nonradiating no shortness of breath associated nausea diaphoresis. Alesse for a few minutes and then relieved itself. She does have acid reflux and had been belching some as well.   She's been taking all of her medicines as prescribed. She continues to have problems with her degenerative disc disease in her lumbar spine. She is following with physical medicine and rehabilitation and has had epidural injections which have helped some. She also continues to take tramadol.    Review Of Systems:  GEN- denies fatigue, fever, weight loss,weakness, recent illness HEENT- denies eye drainage, change in vision, nasal discharge, CVS-+ chest pain, palpitations RESP- denies SOB, cough, wheeze ABD- denies N/V, change in stools, abd pain GU- denies dysuria, hematuria, dribbling, incontinence MSK- + joint pain, muscle aches, injury Neuro- denies headache, dizziness, syncope, seizure activity       Objective:    BP 140/78 mmHg  Pulse 88  Temp(Src) 98.1 F (36.7 C) (Oral)  Resp 16  Ht 5\' 5"  (1.651 m)  Wt 181 lb (82.101 kg)  BMI 30.12 kg/m2 GEN- NAD, alert and oriented x3 HEENT- PERRL, EOMI, non injected sclera, pink conjunctiva, MMM, oropharynx clear CVS- RRR, no murmur RESP-CTAB ABD-NABS,soft,NT,ND EXT- No edema  Pulses- Radial, DP- 2+  EKG- unable to obtain        Assessment & Plan:      Problem List Items Addressed This Visit    Hyperlipidemia   Relevant Orders   Lipid panel   GERD    Based on her history of think her chest discomfort was probably from her acid reflux which is significant. She does see gastroenterology is [place].      Essential  hypertension - Primary    Pressures typically well controlled she did not take her medication this morning. She does sometimes forget to take her evening dose of metoprolol reiterated the importance of this.      Relevant Orders   CBC with Differential/Platelet   Comprehensive metabolic panel   DDD (degenerative disc disease), lumbar    Continue tramadol. She will follow-up with Dr. Ace Gins as needed       Other Visit Diagnoses    Other chest pain        Unable to get a complete EKG due to difficulties with her machine in office. I advised patient she has recurrent chest pain that she needs to go to the emergency room       Note: This dictation was prepared with Dragon dictation along with smaller phrase technology. Any transcriptional errors that result from this process are unintentional.

## 2015-02-11 ENCOUNTER — Telehealth: Payer: Self-pay | Admitting: *Deleted

## 2015-02-11 ENCOUNTER — Encounter: Payer: Self-pay | Admitting: Family Medicine

## 2015-02-11 MED ORDER — TRAMADOL HCL 50 MG PO TABS: 100.0000 mg | ORAL_TABLET | Freq: Two times a day (BID) | ORAL | Status: DC | PRN

## 2015-02-11 MED ORDER — DICLOFENAC SODIUM 1.5 % TD SOLN: TRANSDERMAL | Status: AC

## 2015-02-11 MED ORDER — METOPROLOL TARTRATE 25 MG PO TABS: 25.0000 mg | ORAL_TABLET | Freq: Two times a day (BID) | ORAL | Status: DC

## 2015-02-11 MED ORDER — AMLODIPINE BESYLATE 5 MG PO TABS: 5.0000 mg | ORAL_TABLET | Freq: Every day | ORAL | Status: DC

## 2015-02-11 MED ORDER — LACTULOSE 10 GM/15ML PO SOLN: ORAL | Status: AC

## 2015-02-11 MED ORDER — DEXLANSOPRAZOLE 60 MG PO CPDR: 60.0000 mg | DELAYED_RELEASE_CAPSULE | Freq: Every day | ORAL | Status: DC

## 2015-02-11 MED ORDER — GABAPENTIN 300 MG PO CAPS: ORAL_CAPSULE | ORAL | Status: DC

## 2015-02-11 NOTE — Assessment & Plan Note (Signed)
Based on her history of think her chest discomfort was probably from her acid reflux which is significant. She does see gastroenterology is on [place].

## 2015-02-11 NOTE — Assessment & Plan Note (Signed)
Continue tramadol. She will follow-up with Dr. Ace Gins as needed

## 2015-02-11 NOTE — Telephone Encounter (Signed)
Patent seen in office on 02/10/2015.  Per MD all medications require refill.   Prescription sent to pharmacy.   Tramadol called to pharmacy.

## 2015-02-11 NOTE — Assessment & Plan Note (Signed)
Pressures typically well controlled she did not take her medication this morning. She does sometimes forget to take her evening dose of metoprolol reiterated the importance of this.

## 2015-02-27 DIAGNOSIS — I10 Essential (primary) hypertension: Secondary | ICD-10-CM | POA: Diagnosis not present

## 2015-02-27 DIAGNOSIS — E785 Hyperlipidemia, unspecified: Secondary | ICD-10-CM | POA: Diagnosis not present

## 2015-02-28 LAB — CBC WITH DIFFERENTIAL/PLATELET
BASOS ABS: 0.1 10*3/uL (ref 0.0–0.1)
BASOS PCT: 1 % (ref 0–1)
EOS ABS: 0.2 10*3/uL (ref 0.0–0.7)
Eosinophils Relative: 3 % (ref 0–5)
HCT: 44.7 % (ref 36.0–46.0)
Hemoglobin: 15 g/dL (ref 12.0–15.0)
Lymphocytes Relative: 46 % (ref 12–46)
Lymphs Abs: 2.3 10*3/uL (ref 0.7–4.0)
MCH: 32.1 pg (ref 26.0–34.0)
MCHC: 33.6 g/dL (ref 30.0–36.0)
MCV: 95.5 fL (ref 78.0–100.0)
MPV: 10.5 fL (ref 8.6–12.4)
Monocytes Absolute: 0.4 10*3/uL (ref 0.1–1.0)
Monocytes Relative: 7 % (ref 3–12)
NEUTROS PCT: 43 % (ref 43–77)
Neutro Abs: 2.2 10*3/uL (ref 1.7–7.7)
PLATELETS: 359 10*3/uL (ref 150–400)
RBC: 4.68 MIL/uL (ref 3.87–5.11)
RDW: 13.7 % (ref 11.5–15.5)
WBC: 5.1 10*3/uL (ref 4.0–10.5)

## 2015-02-28 LAB — COMPREHENSIVE METABOLIC PANEL
ALT: 37 U/L — AB (ref 6–29)
AST: 22 U/L (ref 10–35)
Albumin: 4.2 g/dL (ref 3.6–5.1)
Alkaline Phosphatase: 97 U/L (ref 33–130)
BUN: 8 mg/dL (ref 7–25)
CHLORIDE: 101 mmol/L (ref 98–110)
CO2: 27 mmol/L (ref 20–31)
CREATININE: 0.93 mg/dL (ref 0.50–0.99)
Calcium: 9.5 mg/dL (ref 8.6–10.4)
GLUCOSE: 92 mg/dL (ref 70–99)
Potassium: 4.8 mmol/L (ref 3.5–5.3)
SODIUM: 139 mmol/L (ref 135–146)
TOTAL PROTEIN: 7.5 g/dL (ref 6.1–8.1)
Total Bilirubin: 0.6 mg/dL (ref 0.2–1.2)

## 2015-02-28 LAB — LIPID PANEL
CHOLESTEROL: 208 mg/dL — AB (ref 125–200)
HDL: 89 mg/dL (ref >=46)
LDL Cholesterol: 99 mg/dL (ref <130)
Total CHOL/HDL Ratio: 2.3 Ratio (ref <=5.0)
Triglycerides: 99 mg/dL (ref <150)
VLDL: 20 mg/dL (ref <30)

## 2015-05-12 ENCOUNTER — Ambulatory Visit: Payer: Medicare Other | Admitting: Family Medicine

## 2015-05-15 ENCOUNTER — Emergency Department (HOSPITAL_COMMUNITY)
Admission: EM | Admit: 2015-05-15 | Discharge: 2015-05-15 | Disposition: A | Discharge status: Home / Self Care | Payer: Medicare Other | Attending: Emergency Medicine | Admitting: Emergency Medicine

## 2015-05-15 ENCOUNTER — Encounter (HOSPITAL_COMMUNITY): Payer: Self-pay

## 2015-05-15 DIAGNOSIS — L089 Local infection of the skin and subcutaneous tissue, unspecified: Secondary | ICD-10-CM | POA: Insufficient documentation

## 2015-05-15 DIAGNOSIS — M1990 Unspecified osteoarthritis, unspecified site: Secondary | ICD-10-CM | POA: Insufficient documentation

## 2015-05-15 DIAGNOSIS — R21 Rash and other nonspecific skin eruption: Secondary | ICD-10-CM | POA: Diagnosis present

## 2015-05-15 DIAGNOSIS — L989 Disorder of the skin and subcutaneous tissue, unspecified: Secondary | ICD-10-CM | POA: Diagnosis not present

## 2015-05-15 DIAGNOSIS — I10 Essential (primary) hypertension: Secondary | ICD-10-CM | POA: Diagnosis not present

## 2015-05-15 DIAGNOSIS — Z87891 Personal history of nicotine dependence: Secondary | ICD-10-CM | POA: Diagnosis not present

## 2015-05-15 MED ORDER — SULFAMETHOXAZOLE-TRIMETHOPRIM 800-160 MG PO TABS: 1.0000 | ORAL_TABLET | Freq: Two times a day (BID) | ORAL | Status: AC

## 2015-05-15 NOTE — ED Provider Notes (Signed)
CSN: [id number]     Arrival date & time 05/15/15  1551 History   First MD Initiated Contact with Patient 05/15/15 1603     Chief Complaint  Patient presents with  . Rash     (Consider location/radiation/quality/duration/timing/severity/associated sxs/prior Treatment) HPI Tammy Austin is a 65 y.o. female who presents to the ED with a rash. She reports that the areas started first on her left leg and buttock and formed pus pockets and then drained. Now she is getting an area on her right leg and on her face. The areas are tender. She denies fever or other problems.   Past Medical History  Diagnosis Date  . Hypertension   . Reflux   . Diverticular disease   . Arthritis     generalized OA  . Carpal tunnel syndrome   . GERD (gastroesophageal reflux disease)   . Anxiety    Past Surgical History  Procedure Laterality Date  . Abdominal hysterectomy    . Tonsillectomy    . Cholecystectomy  unknown    "about 3-4 years ago" per patient (2013-2014)  . Carpal tunnel release Left 10/25/2013    Procedure: CARPAL TUNNEL RELEASE;  Surgeon: Carole Civil, MD;  Location: [facility];  Service: Orthopedics;  Laterality: Left;  . Esophagogastroduodenoscopy N/A 02/20/2014    RMR: Erosive reflux esophagitis. Patulous EG junction. Small hiatal hernia.  . Colonoscopy N/A 02/20/2014    RMR: Pana colonic diverticulosis. Incomplete colonscopy because of inadequate preparation.   . Colonoscopy with propofol N/A 05/01/2014    RMR; Internal hemorrhoids. Colonic diverticulosis. REcent hemoglobin normal   Family History  Problem Relation Age of Onset  . Arthritis Mother   . Diabetes Father   . Hypertension Sister   . Depression Paternal Uncle   . Colon cancer Maternal Grandmother    Social History  Substance Use Topics  . Smoking status: Former Smoker -- 1.00 packs/day for 20 years    Types: Cigarettes, E-cigarettes    Start date: 09/20/1970    Quit date: 07/17/2013  . Smokeless tobacco: Former Systems developer     Quit date: 06/12/2013     Comment: trying to quit with electronic cigarette  . Alcohol Use: No     Comment: (1) beer per day-   OB History    No data available     Review of Systems Negative except as stated in HPI   Allergies  Ibuprofen  Home Medications   Prior to Admission medications   Medication Sig Start Date End Date Taking? Authorizing Provider  amLODipine (NORVASC) 5 MG tablet Take 1 tablet (5 mg total) by mouth daily. 02/11/15   Alycia Rossetti, MD  dexlansoprazole (DEXILANT) 60 MG capsule Take 1 capsule (60 mg total) by mouth daily. 02/11/15   Alycia Rossetti, MD  Diclofenac Sodium 1.5 % SOLN Apply to affected joints three time a day as needed 02/11/15   Alycia Rossetti, MD  gabapentin (NEURONTIN) 300 MG capsule TAKE ONE CAPSULE BY MOUTH EVERY MORNING AND TAKE TWO (2) CAPSULES AT BEDTIME. 02/11/15   Alycia Rossetti, MD  lactulose (CHRONULAC) 10 GM/15ML solution TAKE 3 TABLESPOONSFUL (45 ML) BY MOUTH DAILY AS NEEDED FOR MILD CONSTIPATION 02/11/15   Alycia Rossetti, MD  metoprolol tartrate (LOPRESSOR) 25 MG tablet Take 1 tablet (25 mg total) by mouth 2 (two) times daily. 02/11/15   Alycia Rossetti, MD  multivitamin-iron-minerals-folic acid (CENTRUM) chewable tablet Chew 1 tablet by mouth daily.    Historical Provider, MD  sulfamethoxazole-trimethoprim (BACTRIM DS,SEPTRA DS) 800-160 MG tablet Take 1 tablet by mouth 2 (two) times daily. 05/15/15 05/22/15  Hope Bunnie Pion, NP  traMADol (ULTRAM) 50 MG tablet Take 2 tablets (100 mg total) by mouth every 12 (twelve) hours as needed. 02/11/15   Alycia Rossetti, MD   BP 160/85 mmHg  Pulse 76  Temp(Src) 98.4 F (36.9 C) (Oral)  Resp 18  Ht 5\' 5"  (1.651 m)  Wt 81.647 kg  BMI 29.95 kg/m2  SpO2 100% Physical Exam  Constitutional: She is oriented to person, place, and time. She appears well-developed and well-nourished. No distress.  HENT:  Head: Atraumatic.  Eyes: Conjunctivae and EOM are normal.  Neck: Normal range of motion.  Neck supple.  Cardiovascular: Normal rate and regular rhythm.   Pulmonary/Chest: Effort normal and breath sounds normal.  Abdominal: Soft. There is no tenderness.  Musculoskeletal: Normal range of motion.  Neurological: She is alert and oriented to person, place, and time. No cranial nerve deficit.  Skin: Skin is warm and dry.  There are healing scabbing areas noted to the right buttock and right upper leg. There is an area to the left leg that is raised with erythema.  There are two areas to the face that are raised with minimal erythema. No red streaking noted.   Psychiatric: She has a normal mood and affect. Her behavior is normal.  Nursing note and vitals reviewed.   ED Course  Procedures (including critical care time) Labs Review Labs Reviewed - No data to display   MDM  65 y.o. female with infected areas that appear to have started as insect bites stable for d/c without fever and does not appear toxic. Will treat with Bactrim and she will f/u with her PCP or return here as needed for worsening symptoms.   Final diagnoses:  Skin infection      Ashley Murrain, NP 05/15/15 1933  Daleen Bo, MD 05/16/15 [id number]

## 2015-05-15 NOTE — ED Notes (Signed)
Pt reports for the past month has had itchy bumps that have came and went in random areas of her body.  Pt presently has one on r side of face.

## 2015-05-15 NOTE — Discharge Instructions (Signed)
Your exam shows that you have a skin infection that may be the result of insect bites. Take the antibiotic as directed and follow up with your doctor if symptoms do not improve. Return here as needed.

## 2015-05-30 ENCOUNTER — Encounter: Payer: Self-pay | Admitting: Family Medicine

## 2015-06-11 ENCOUNTER — Encounter: Payer: Self-pay | Admitting: Family Medicine

## 2015-06-12 ENCOUNTER — Ambulatory Visit (INDEPENDENT_AMBULATORY_CARE_PROVIDER_SITE_OTHER): Payer: Medicare Other | Admitting: Family Medicine

## 2015-06-12 ENCOUNTER — Encounter: Payer: Self-pay | Admitting: Family Medicine

## 2015-06-12 VITALS — BP 138/78 | HR 82 | Temp 97.7°F | Resp 16 | Ht 65.0 in | Wt 177.0 lb

## 2015-06-12 DIAGNOSIS — E785 Hyperlipidemia, unspecified: Secondary | ICD-10-CM | POA: Diagnosis not present

## 2015-06-12 DIAGNOSIS — K5900 Constipation, unspecified: Secondary | ICD-10-CM | POA: Diagnosis not present

## 2015-06-12 DIAGNOSIS — M5136 Other intervertebral disc degeneration, lumbar region: Secondary | ICD-10-CM

## 2015-06-12 DIAGNOSIS — I10 Essential (primary) hypertension: Secondary | ICD-10-CM | POA: Diagnosis not present

## 2015-06-12 DIAGNOSIS — M51369 Other intervertebral disc degeneration, lumbar region without mention of lumbar back pain or lower extremity pain: Secondary | ICD-10-CM

## 2015-06-12 MED ORDER — TRAMADOL HCL 50 MG PO TABS: 100.0000 mg | ORAL_TABLET | Freq: Two times a day (BID) | ORAL | Status: DC | PRN

## 2015-06-12 MED ORDER — METOPROLOL TARTRATE 25 MG PO TABS: 25.0000 mg | ORAL_TABLET | Freq: Two times a day (BID) | ORAL | Status: AC

## 2015-06-12 MED ORDER — AMLODIPINE BESYLATE 5 MG PO TABS: 5.0000 mg | ORAL_TABLET | Freq: Every day | ORAL | Status: AC

## 2015-06-12 MED ORDER — DEXLANSOPRAZOLE 60 MG PO CPDR: 60.0000 mg | DELAYED_RELEASE_CAPSULE | Freq: Every day | ORAL | Status: AC

## 2015-06-12 MED ORDER — GABAPENTIN 300 MG PO CAPS: ORAL_CAPSULE | ORAL | Status: AC

## 2015-06-12 NOTE — Patient Instructions (Signed)
Take the tylenol with ultram  Continue current medications F/U as needed

## 2015-06-12 NOTE — Assessment & Plan Note (Signed)
Well controlled 

## 2015-06-12 NOTE — Assessment & Plan Note (Signed)
Continue with ultram and tylenol, also uses naprosyn but try to limit due to history of GI bleed

## 2015-06-12 NOTE — Assessment & Plan Note (Signed)
Continue lactulose

## 2015-06-12 NOTE — Progress Notes (Signed)
Patient ID: Tammy Austin, female   DOB: 1950-04-30, 65 y.o.   MRN: [id number]     Subjective:    Patient ID: Tammy Austin, female    DOB: [birth year], 65 y.o.   MRN: [id number]  Patient presents for 4 month F/U Patient to follow-up chronic medical problems. She will be moving to [place] this year Her chronic medical problems and medications were reviewed. She is taking her blood pressure medicine as prescribed as well as her GI medications. She has acid reflux as well as history of GI bleed. She has chronic back pain which she does follow with PM&R, she is on tramadol for pain control She did not get much improvement with her epidural injections she also continues to have chronic knee pain but does not want have any surgical extension on any of these items until she misses [place].  Review Of Systems:  GEN- denies fatigue, fever, weight loss,weakness, recent illness HEENT- denies eye drainage, change in vision, nasal discharge, CVS- denies chest pain, palpitations RESP- denies SOB, cough, wheeze ABD- denies N/V, change in stools, abd pain GU- denies dysuria, hematuria, dribbling, incontinence MSK- + joint pain, muscle aches, injury Neuro- denies headache, dizziness, syncope, seizure activity       Objective:    BP 138/78 mmHg  Pulse 82  Temp(Src) 97.7 F (36.5 C) (Oral)  Resp 16  Ht 5\' 5"  (1.651 m)  Wt 177 lb (80.287 kg)  BMI 29.45 kg/m2 GEN- NAD, alert and oriented x3 HEENT- PERRL, EOMI, non injected sclera, pink conjunctiva, MMM, oropharynx clear Neck- Supple, no thyromegaly CVS- RRR, no murmur RESP-CTAB ABD-NABS,soft,NT,ND EXT- No edema Pulses- Radial, DP- 2+        Assessment & Plan:      Problem List Items Addressed This Visit    Hyperlipidemia - Primary   Relevant Medications   amLODipine (NORVASC) 5 MG tablet   metoprolol tartrate (LOPRESSOR) 25 MG tablet   Essential hypertension    Well controlled       Relevant Medications   amLODipine (NORVASC) 5 MG tablet   metoprolol tartrate (LOPRESSOR) 25 MG tablet   DDD (degenerative disc disease), lumbar    Continue with ultram and tylenol, also uses naprosyn but try to limit due to history of GI bleed      Relevant Medications   traMADol (ULTRAM) 50 MG tablet   Constipation    Continue lactulose         Note: This dictation was prepared with Dragon dictation along with smaller phrase technology. Any transcriptional errors that result from this process are unintentional.

## 2015-08-17 ENCOUNTER — Other Ambulatory Visit: Payer: Self-pay | Admitting: *Deleted

## 2015-08-17 MED ORDER — NABUMETONE 500 MG PO TABS: 500.0000 mg | ORAL_TABLET | Freq: Two times a day (BID) | ORAL | 5 refills | Status: AC

## 2015-10-02 DIAGNOSIS — I10 Essential (primary) hypertension: Secondary | ICD-10-CM | POA: Diagnosis not present

## 2015-10-02 DIAGNOSIS — Z760 Encounter for issue of repeat prescription: Secondary | ICD-10-CM | POA: Diagnosis not present

## 2015-10-09 ENCOUNTER — Other Ambulatory Visit: Payer: Self-pay | Admitting: Family Medicine

## 2015-10-09 NOTE — Telephone Encounter (Signed)
Ok to refill??  Last office visit/ refill 06/12/2015, #2 refills.

## 2015-10-11 NOTE — Telephone Encounter (Signed)
okay

## 2015-10-12 NOTE — Telephone Encounter (Signed)
Medication called to pharmacy. 

## 2015-11-02 ENCOUNTER — Encounter: Payer: Self-pay | Admitting: Internal Medicine

## 2015-12-28 IMAGING — MG MM DIGITAL SCREENING BILAT W/ CAD
4 series · 4 of 4 positions shown · non-contrast
Comparison: Previous Exam(s)

CLINICAL DATA: Screening.

EXAM:
DIGITAL SCREENING BILATERAL MAMMOGRAM WITH CAD

[L CC]
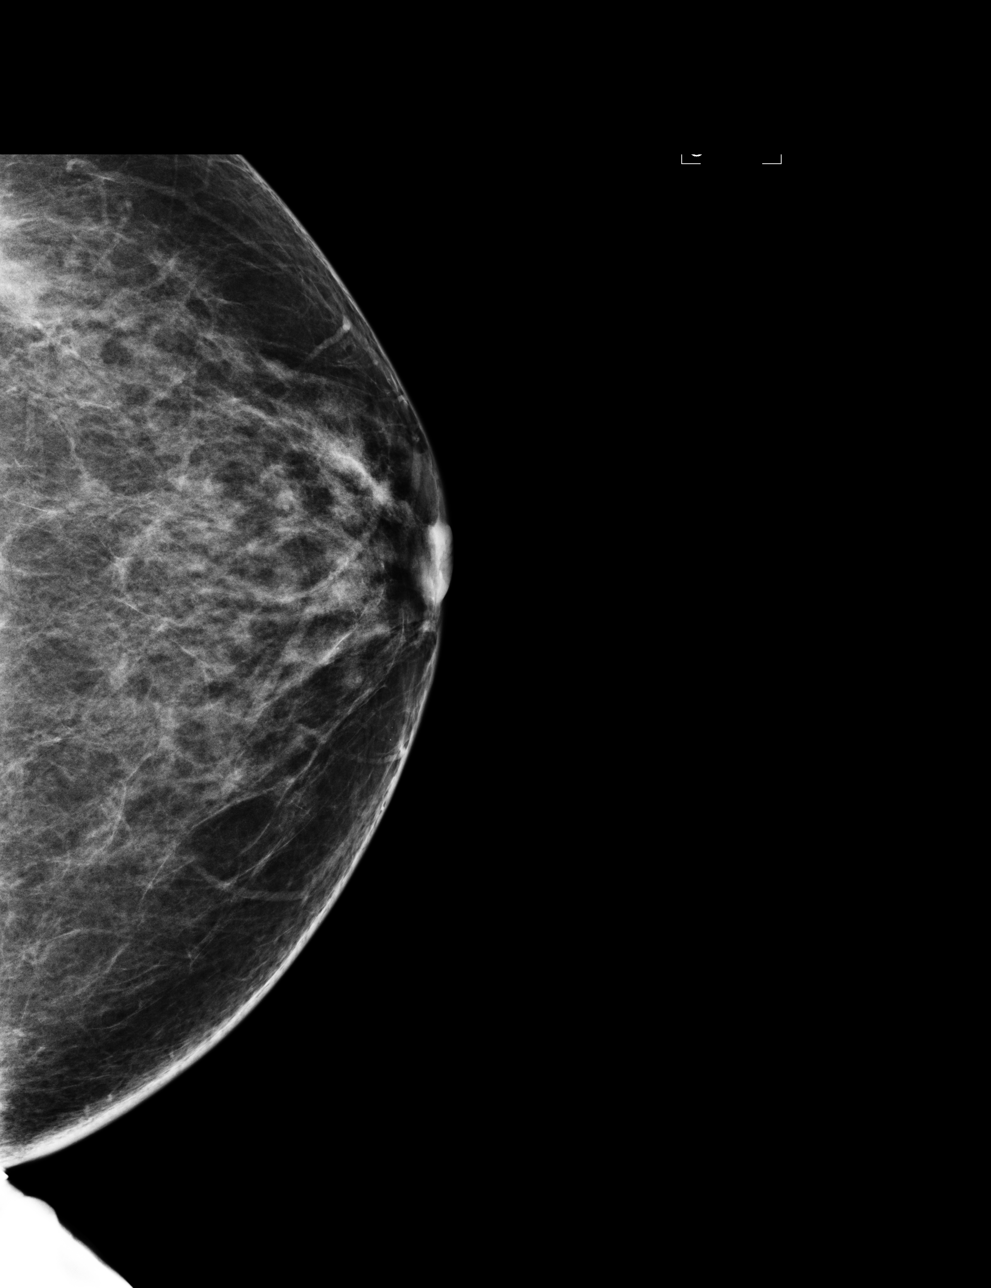

[L MLO]
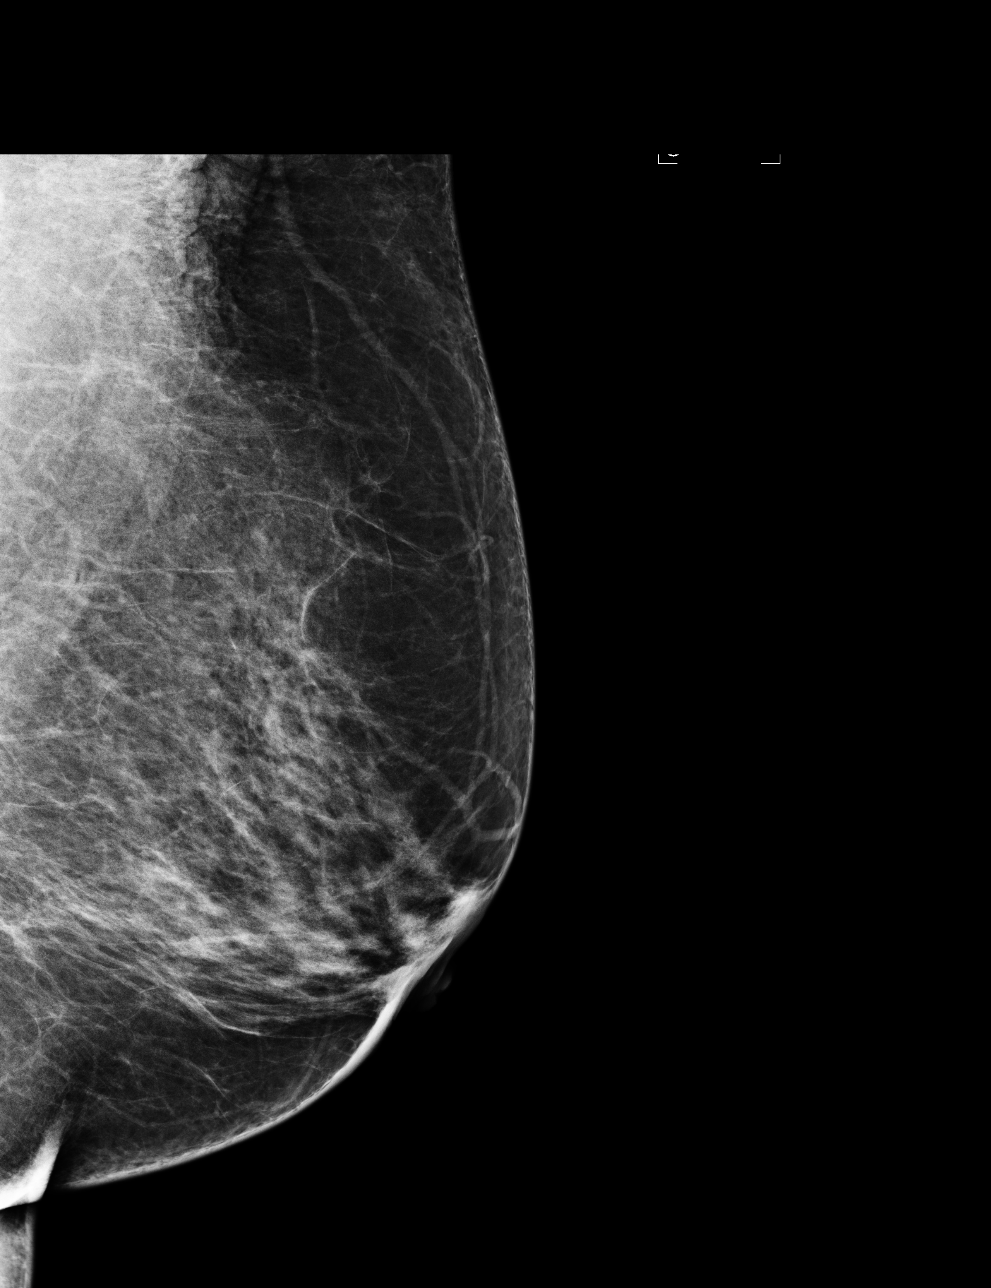

[R CC]
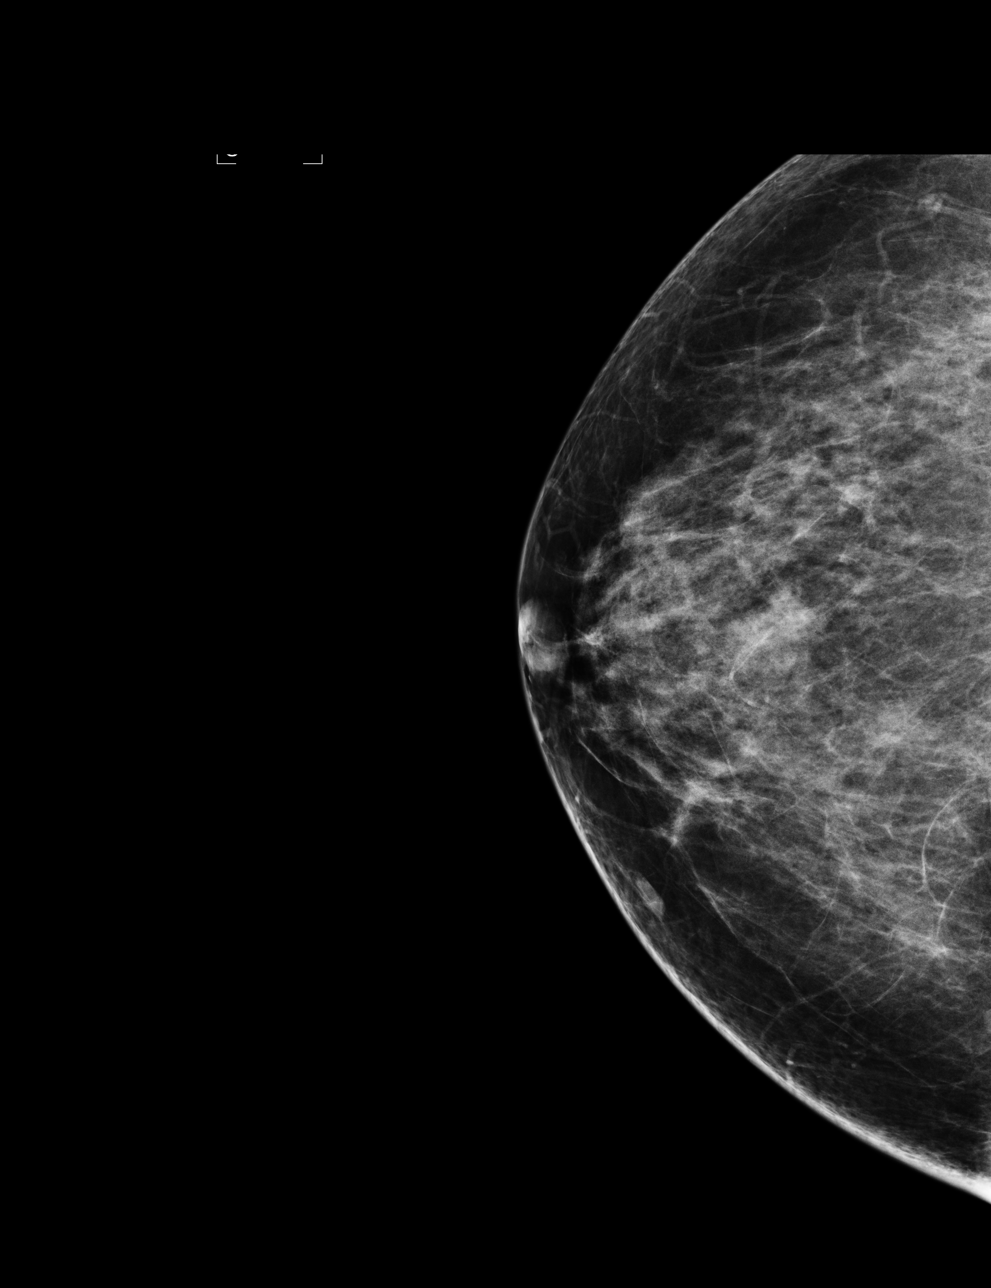

[R MLO]
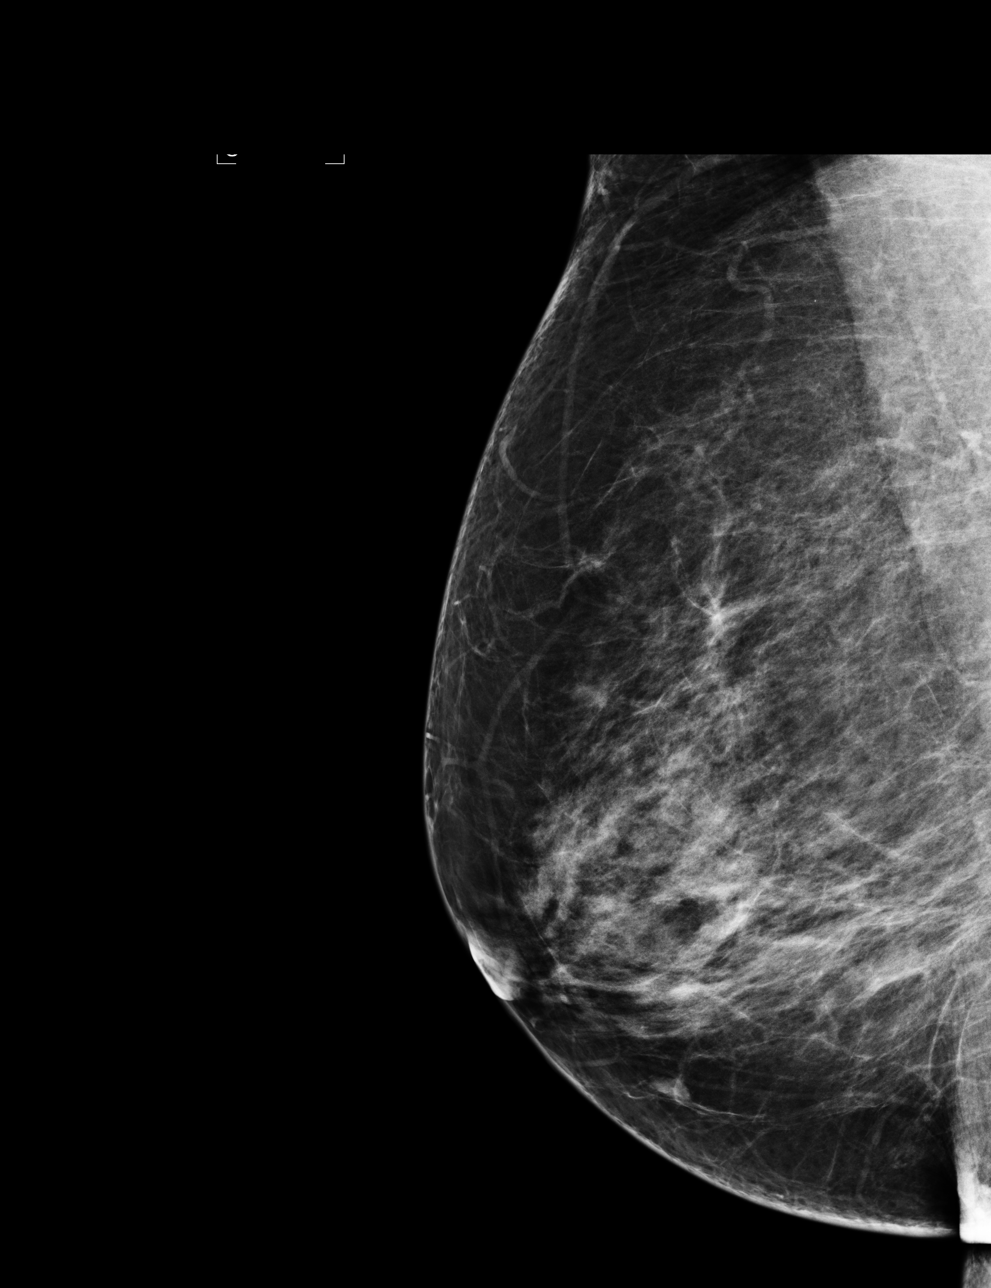

[4 of 4 positions shown; findings below may reference images not displayed]

ACR Breast Density Category c: The breast tissue is heterogeneously
dense, which may obscure small masses.
FINDINGS: In the right breast, possible distortion warrants further evaluation
with spot compression views and possibly ultrasound. In the left
breast, no findings suspicious for malignancy. Images were processed
with CAD.
IMPRESSION: Further evaluation is suggested for possible distortion in the right
breast.

RECOMMENDATION:
Diagnostic mammogram and possibly ultrasound of the right breast.
(Code:[id number])

The patient will be contacted regarding the findings, and additional
imaging will be scheduled.

BI-RADS CATEGORY  0: Incomplete. Need additional imaging evaluation
and/or prior mammograms for comparison.

## 2018-02-21 ENCOUNTER — Encounter: Payer: Self-pay | Admitting: Internal Medicine
